# Patient Record
Sex: Female | Born: 1955 | Race: White | Hispanic: No | Marital: Married | Smoking: Former smoker
Health system: Southern US, Community
[De-identification: ages and names within clinical notes are randomized; demographics above are authoritative.]

## PROBLEM LIST (undated history)

## (undated) DIAGNOSIS — F329 Major depressive disorder, single episode, unspecified: Secondary | ICD-10-CM

## (undated) DIAGNOSIS — E079 Disorder of thyroid, unspecified: Secondary | ICD-10-CM

## (undated) DIAGNOSIS — M1990 Unspecified osteoarthritis, unspecified site: Secondary | ICD-10-CM

## (undated) DIAGNOSIS — I639 Cerebral infarction, unspecified: Secondary | ICD-10-CM

## (undated) DIAGNOSIS — K219 Gastro-esophageal reflux disease without esophagitis: Secondary | ICD-10-CM

## (undated) DIAGNOSIS — N159 Renal tubulo-interstitial disease, unspecified: Secondary | ICD-10-CM

## (undated) DIAGNOSIS — G459 Transient cerebral ischemic attack, unspecified: Secondary | ICD-10-CM

## (undated) DIAGNOSIS — F32A Depression, unspecified: Secondary | ICD-10-CM

## (undated) HISTORY — PX: TONSILLECTOMY: SUR1361

## (undated) HISTORY — PX: BACK SURGERY: SHX140

## (undated) HISTORY — PX: SHOULDER SURGERY: SHX246

## (undated) HISTORY — PX: CHOLECYSTECTOMY: SHX55

---

## 2010-09-05 ENCOUNTER — Emergency Department (HOSPITAL_BASED_OUTPATIENT_CLINIC_OR_DEPARTMENT_OTHER)
Admission: EM | Admit: 2010-09-05 | Discharge: 2010-09-05 | Disposition: A | Discharge status: Home / Self Care | Payer: Medicare PPO | Attending: Emergency Medicine | Admitting: Emergency Medicine

## 2010-09-05 ENCOUNTER — Emergency Department (INDEPENDENT_AMBULATORY_CARE_PROVIDER_SITE_OTHER): Payer: Medicare PPO

## 2010-09-05 DIAGNOSIS — E119 Type 2 diabetes mellitus without complications: Secondary | ICD-10-CM | POA: Insufficient documentation

## 2010-09-05 DIAGNOSIS — R1013 Epigastric pain: Secondary | ICD-10-CM | POA: Insufficient documentation

## 2010-09-05 DIAGNOSIS — G8929 Other chronic pain: Secondary | ICD-10-CM | POA: Insufficient documentation

## 2010-09-05 DIAGNOSIS — J45909 Unspecified asthma, uncomplicated: Secondary | ICD-10-CM | POA: Insufficient documentation

## 2010-09-05 DIAGNOSIS — K219 Gastro-esophageal reflux disease without esophagitis: Secondary | ICD-10-CM | POA: Insufficient documentation

## 2010-09-05 DIAGNOSIS — Z79899 Other long term (current) drug therapy: Secondary | ICD-10-CM | POA: Insufficient documentation

## 2010-09-05 DIAGNOSIS — R109 Unspecified abdominal pain: Secondary | ICD-10-CM

## 2010-09-05 DIAGNOSIS — R079 Chest pain, unspecified: Secondary | ICD-10-CM | POA: Insufficient documentation

## 2010-09-05 DIAGNOSIS — I10 Essential (primary) hypertension: Secondary | ICD-10-CM | POA: Insufficient documentation

## 2010-09-05 DIAGNOSIS — E7800 Pure hypercholesterolemia, unspecified: Secondary | ICD-10-CM | POA: Insufficient documentation

## 2010-09-05 LAB — CBC
Hemoglobin: 11.6 g/dL — ABNORMAL LOW (ref 12.0–15.0)
Platelets: 199 10*3/uL (ref 150–400)
RBC: 3.9 MIL/uL (ref 3.87–5.11)
WBC: 6.6 10*3/uL (ref 4.0–10.5)

## 2010-09-05 LAB — COMPREHENSIVE METABOLIC PANEL
ALT: 34 U/L (ref 0–35)
AST: 38 U/L — ABNORMAL HIGH (ref 0–37)
Albumin: 4.1 g/dL (ref 3.5–5.2)
Alkaline Phosphatase: 57 U/L (ref 39–117)
CO2: 26 mEq/L (ref 19–32)
Chloride: 104 mEq/L (ref 96–112)
GFR calc Af Amer: >60 mL/min (ref >60)
Potassium: 4.6 mEq/L (ref 3.5–5.1)
Sodium: 143 mEq/L (ref 135–145)
Total Bilirubin: 0.6 mg/dL (ref 0.3–1.2)

## 2010-09-05 LAB — URINALYSIS, ROUTINE W REFLEX MICROSCOPIC
Glucose, UA: NEGATIVE mg/dL
Protein, ur: NEGATIVE mg/dL
Specific Gravity, Urine: 1.022 (ref 1.005–1.030)
pH: 6 (ref 5.0–8.0)

## 2010-09-05 LAB — DIFFERENTIAL
Basophils Relative: 0 % (ref 0–1)
Eosinophils Absolute: 0.1 10*3/uL (ref 0.0–0.7)
Neutro Abs: 2.8 10*3/uL (ref 1.7–7.7)
Neutrophils Relative %: 42 % — ABNORMAL LOW (ref 43–77)

## 2010-09-05 LAB — URINE MICROSCOPIC-ADD ON

## 2010-09-05 LAB — POCT CARDIAC MARKERS: Myoglobin, poc: 30.8 ng/mL (ref 12–200)

## 2010-09-05 IMAGING — CR DG CHEST 2V
2 series · 2 of 2 positions shown · non-contrast
Comparison: None

CLINICAL DATA: Chest pain

CHEST - 2 VIEW

[w chest pa]
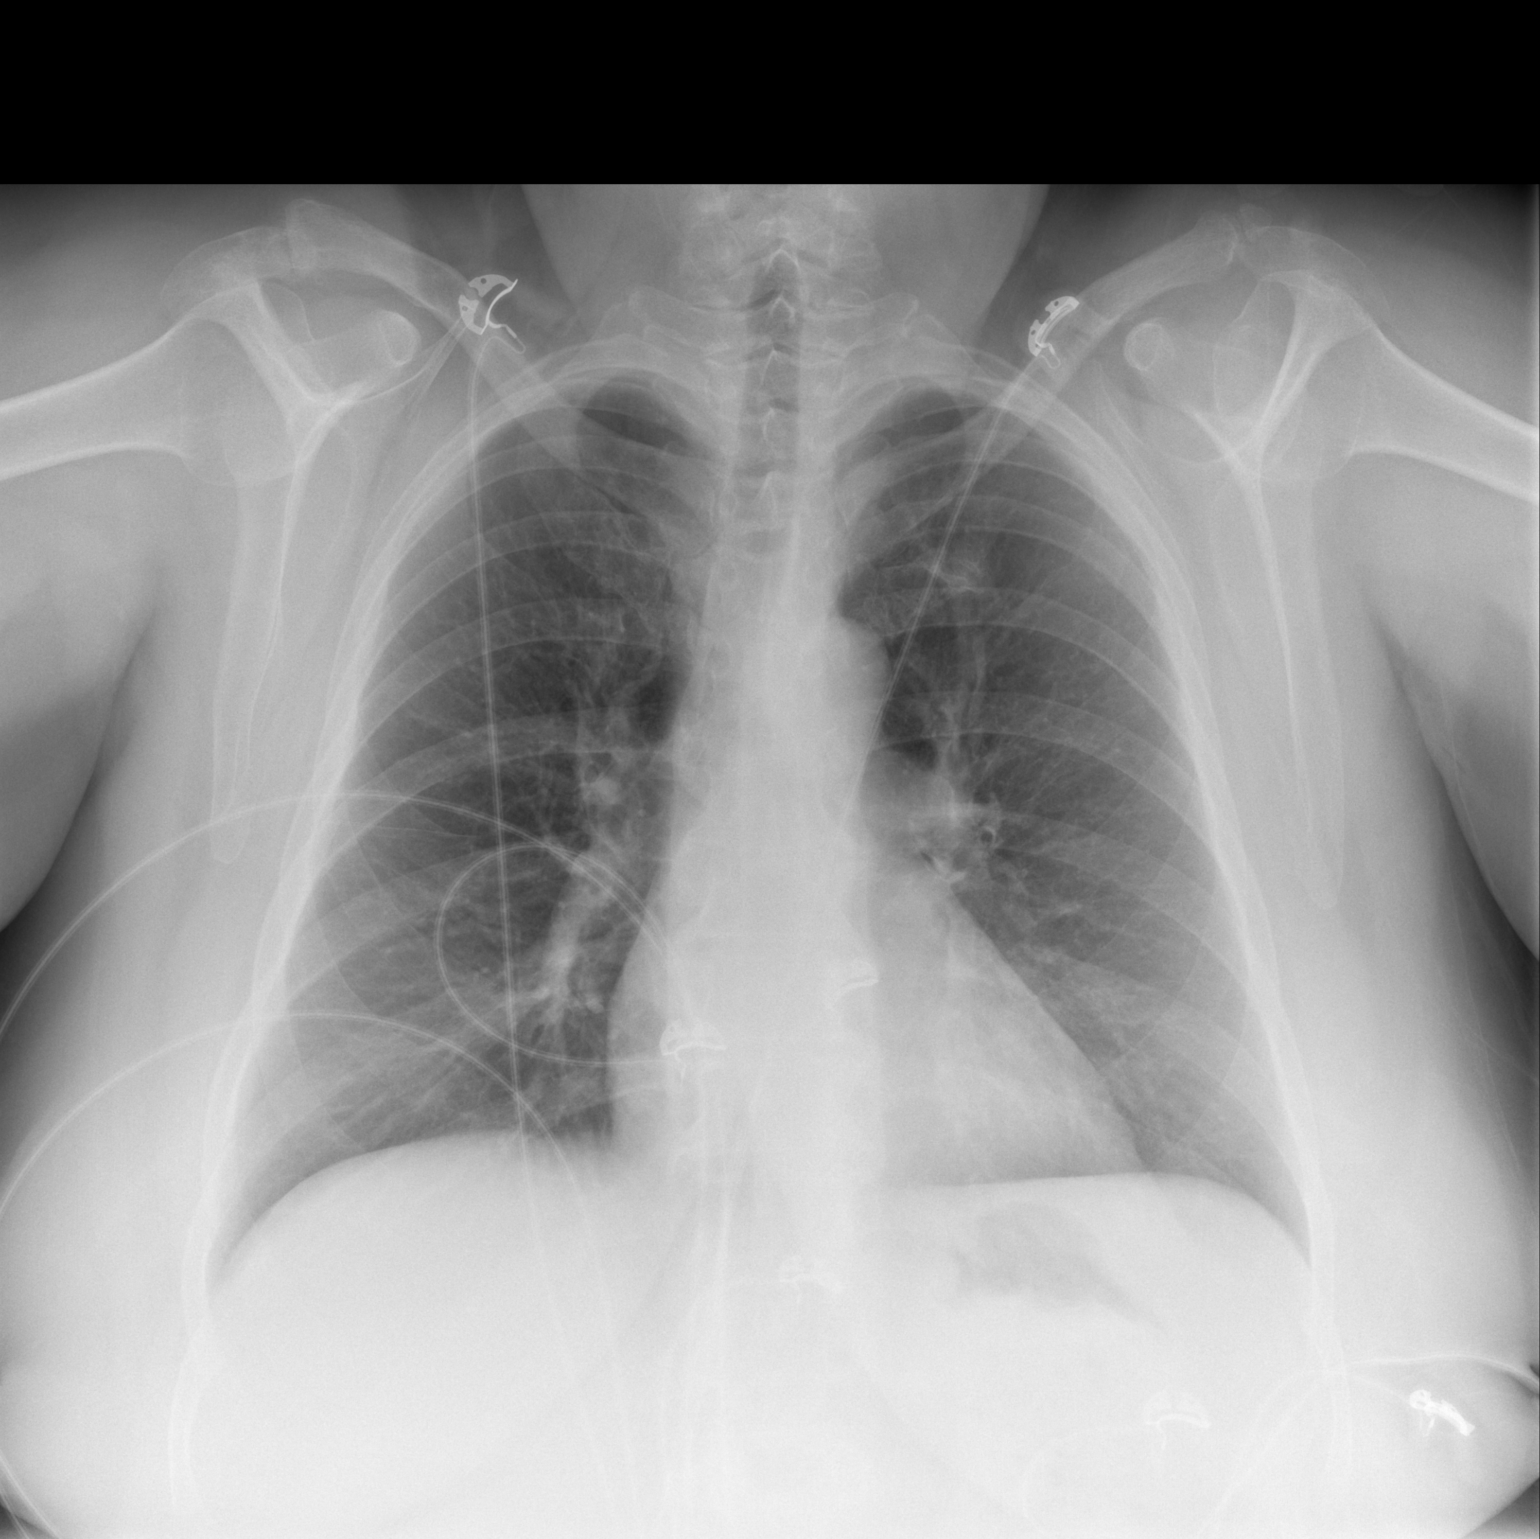

[w chest lat]
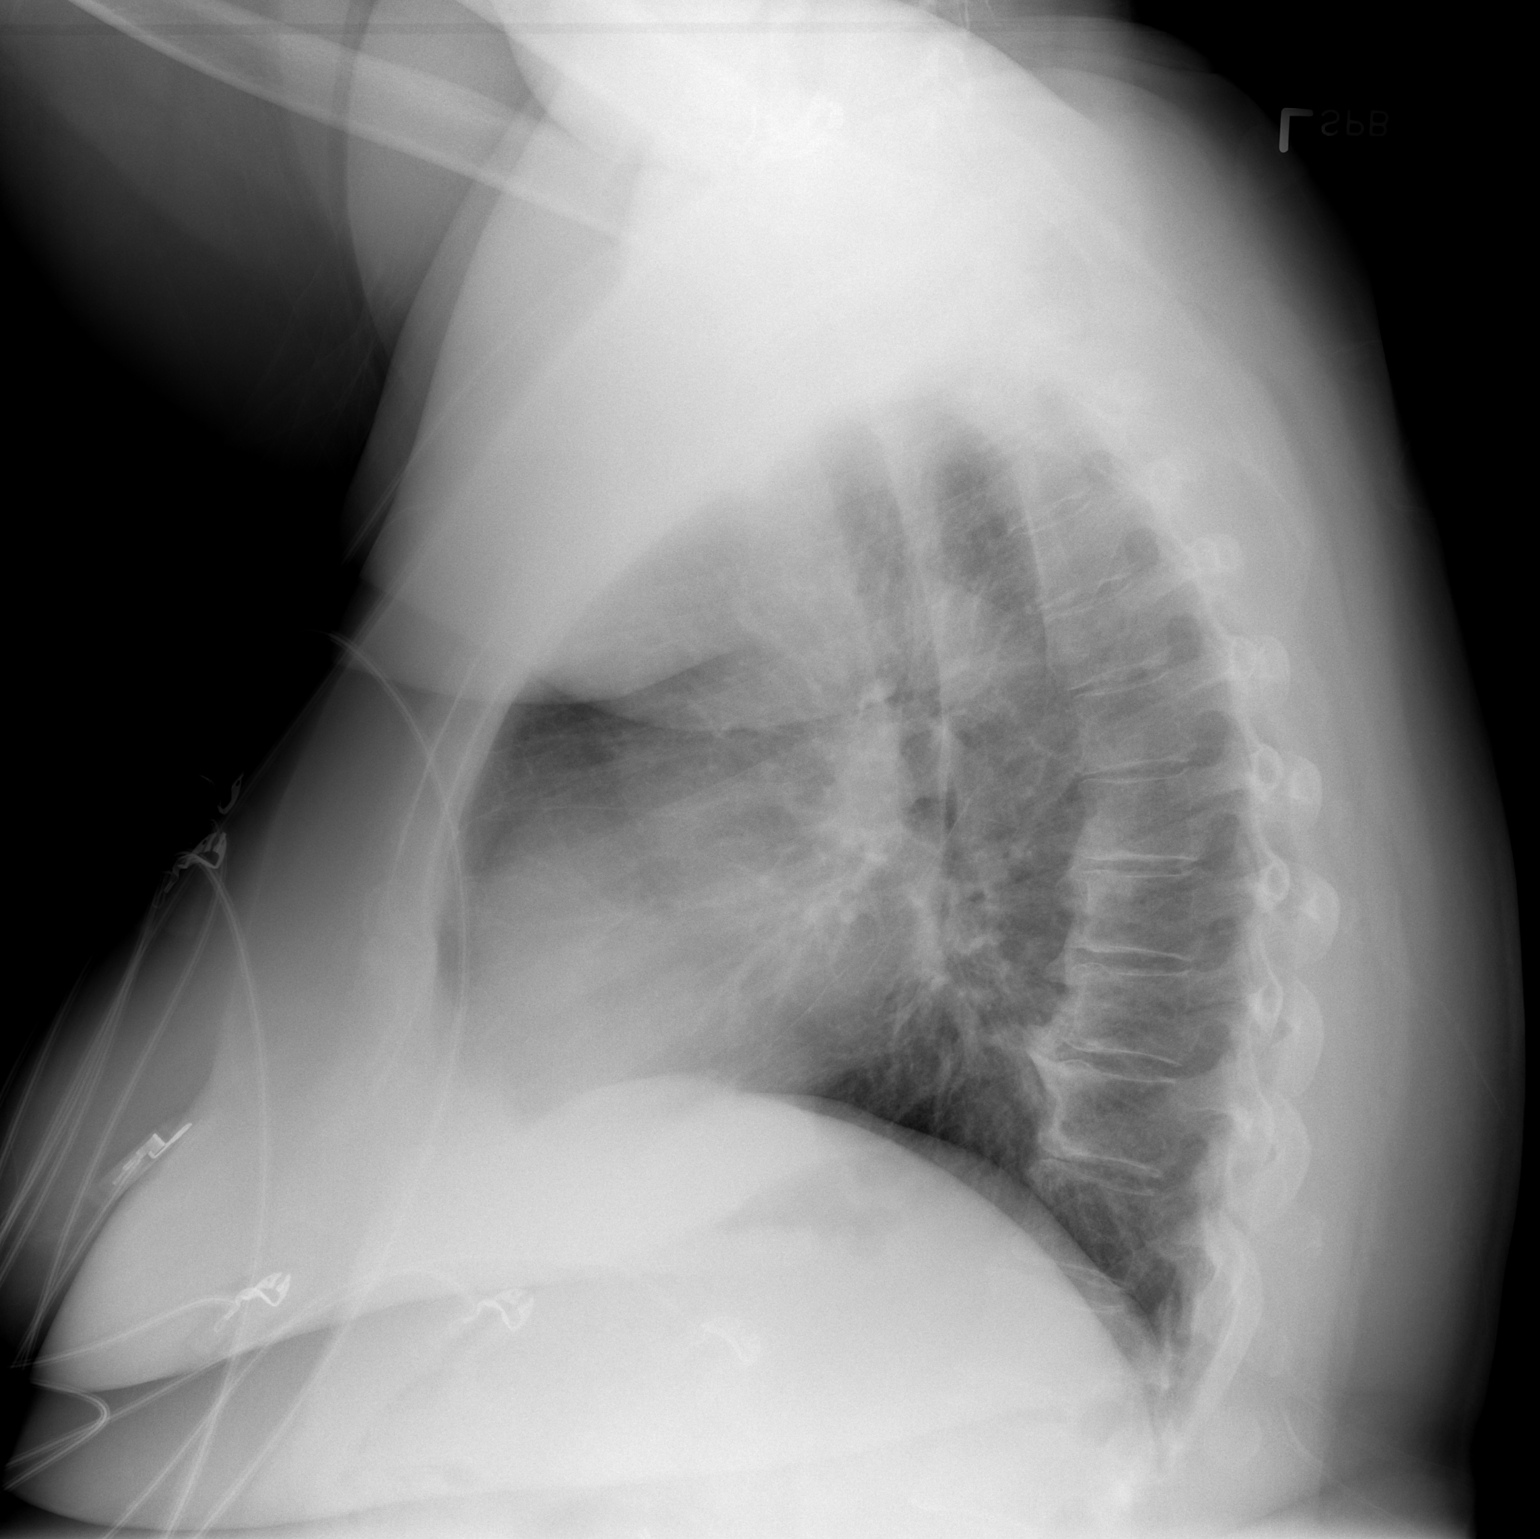

[2 of 2 positions shown; findings below may reference images not displayed]

FINDINGS: The cardiomediastinal silhouette is unremarkable.
There is no evidence of focal airspace disease, pulmonary edema,
pulmonary nodule/mass, pleural effusion, or pneumothorax.
No acute bony abnormalities are identified.
IMPRESSION: No evidence of acute cardiopulmonary disease.

## 2010-09-05 IMAGING — CT CT ABD-PELV W/ CM
2 of 5 series · 17 of 46 positions shown, 19 images · IV contrast (APPLIED)
Comparison: None.

CLINICAL DATA: Abdominal pain.

CT ABDOMEN AND PELVIS WITH CONTRAST
TECHNIQUE: Multidetector CT imaging of the abdomen and pelvis was
performed following the standard protocol during bolus
administration of intravenous contrast.
Contrast: 100 ml of [XP]

[Series 2: abd/pelvis 5.0 b31f · axial · 0.82mm/px · z∈[-464,-39]mm · 14 of 95 slices shown, 16 images]
[im 5/95  soft-tissue]
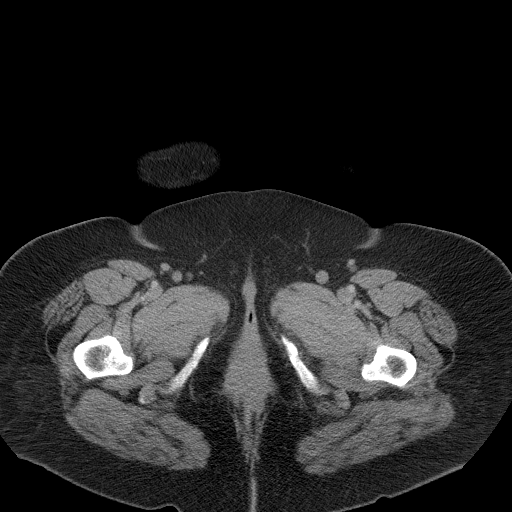
[im 5/95  bone]
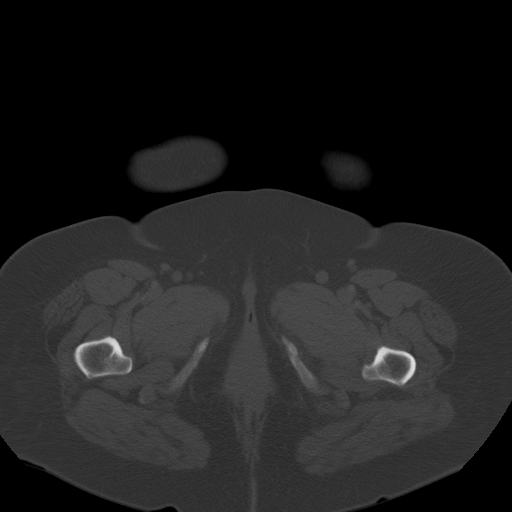
[im 15/95  soft-tissue]
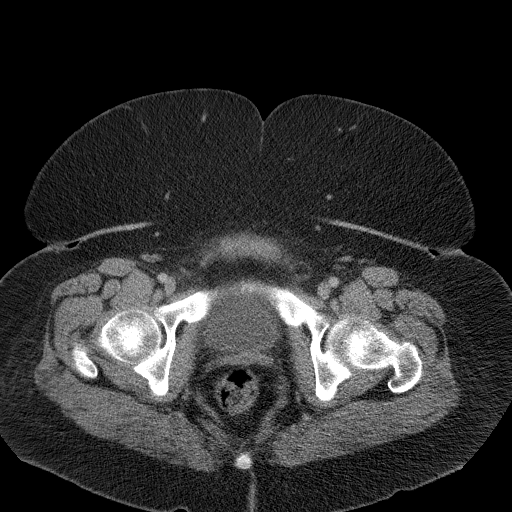
[im 19/95  soft-tissue]
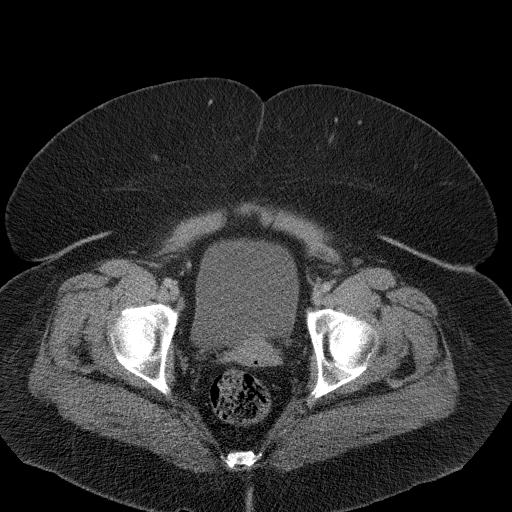
[im 24/95  soft-tissue]
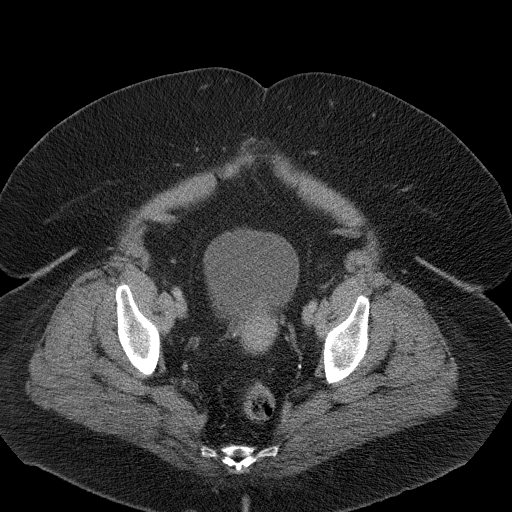
[im 33/95  soft-tissue]
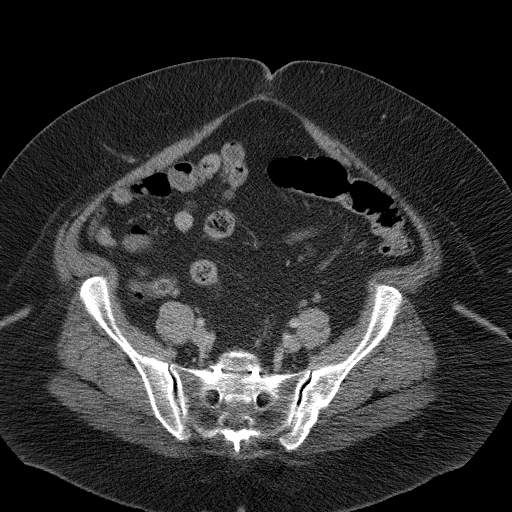
[im 38/95  soft-tissue]
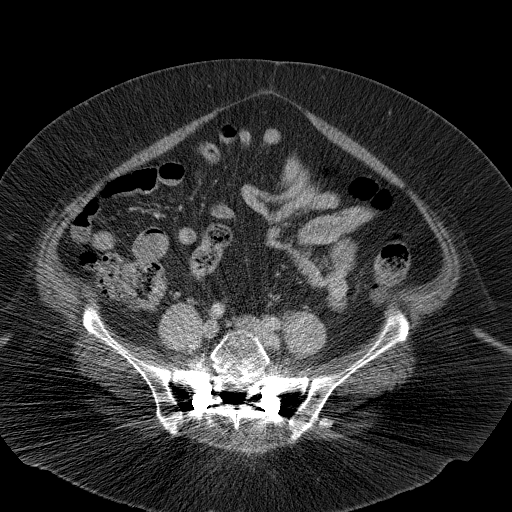
[im 43/95  soft-tissue]
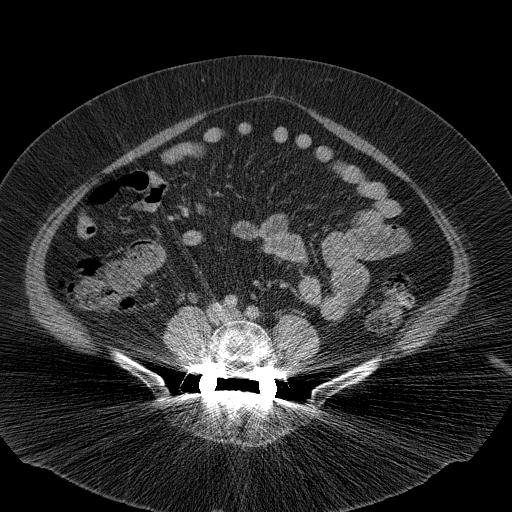
[im 52/95  soft-tissue]
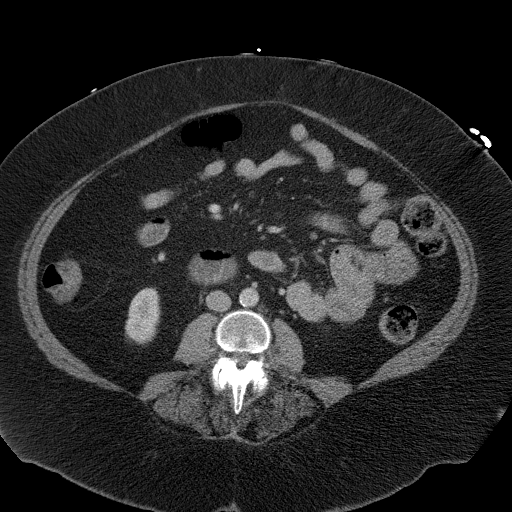
[im 57/95  soft-tissue]
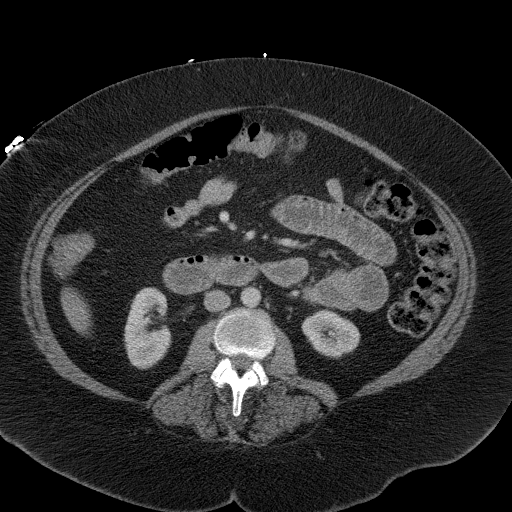
[im 57/95  bone]
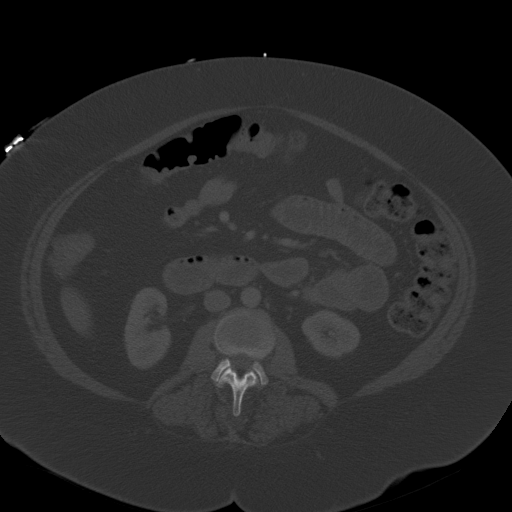
[im 62/95  soft-tissue]
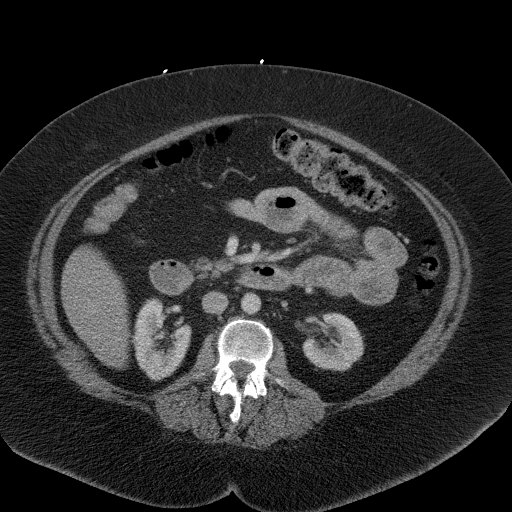
[im 71/95  soft-tissue]
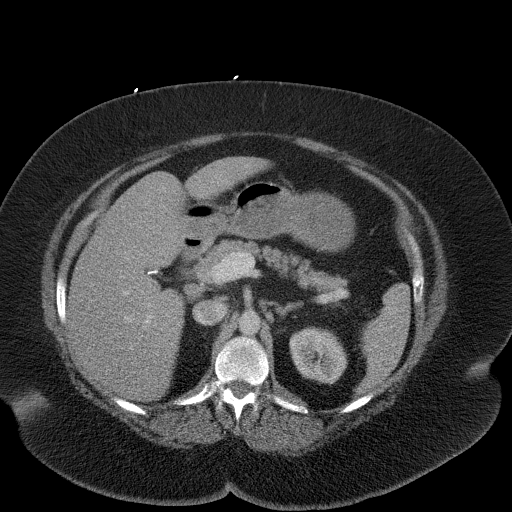
[im 76/95  soft-tissue]
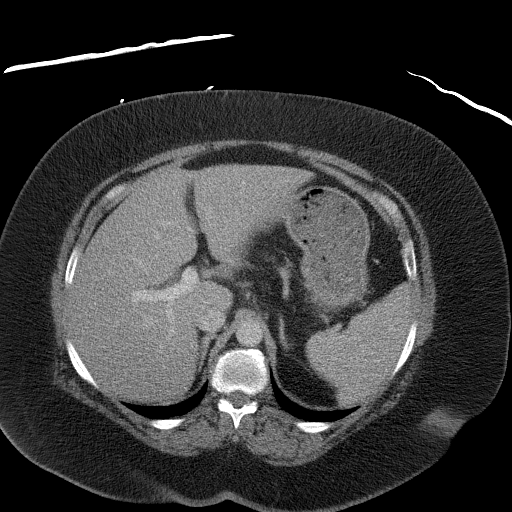
[im 80/95  soft-tissue]
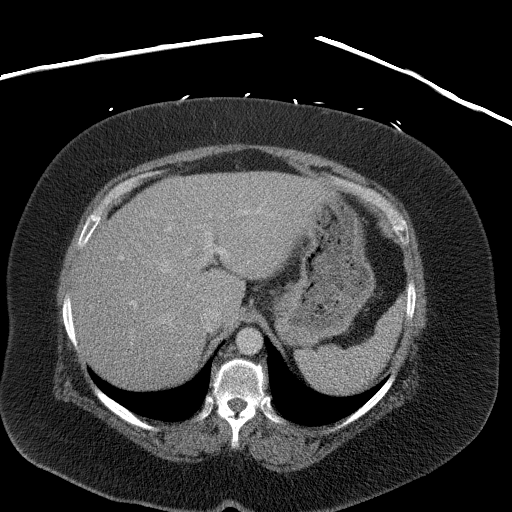
[im 90/95  soft-tissue]
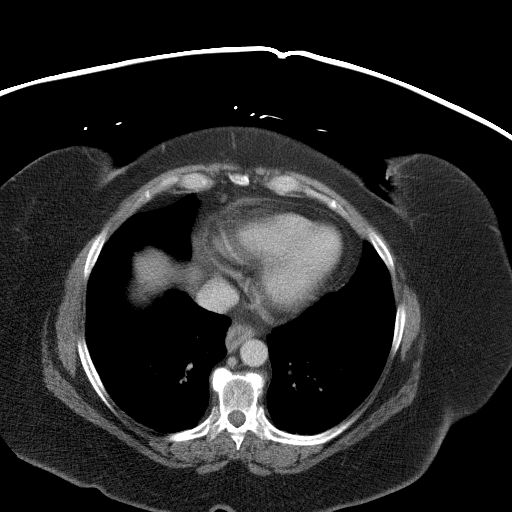

[Series 5: abd/pelvis 3.0 coronal · coronal · 0.96mm/px · 3 of 104 slices shown]
[im 35/104  soft-tissue]
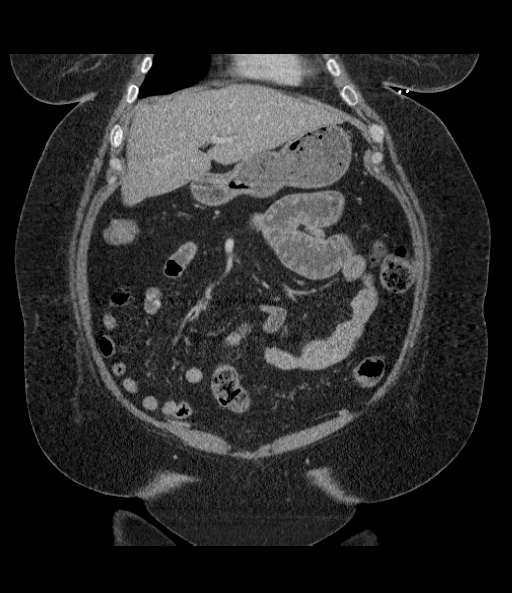
[im 46/104  soft-tissue]
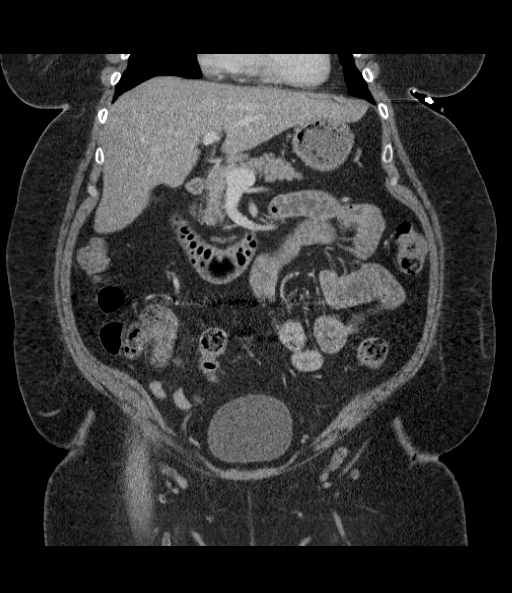
[im 58/104  soft-tissue]
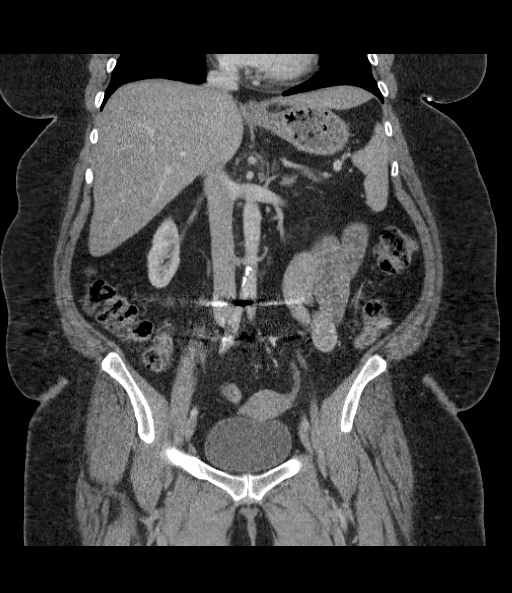

[17 of 46 positions shown; findings below may reference images not displayed]

FINDINGS: The lung bases are clear.  The liver, spleen, adrenal
glands and pancreas are normal.  The kidneys enhance symmetrically.
No hydronephrosis is present.  Some mildly prominent loops of
proximal jejunum are seen in the left upper quadrant without
obstruction. No bowel wall thickening is seen in this area.  The
appendix is absent.  The uterus and ovaries are normal.  Urinary
bladder is unremarkable.  No free fluid or adenopathy is seen in
the abdomen or pelvis.  Posterior lumbar fusion is seen at L4-S1.
There are no aggressive lytic or blastic bone lesions seen.
IMPRESSION: 1.  No acute findings in the abdomen or pelvis.  Minimal prominence
of the proximal jejunum without small bowel obstruction.
2.  Postsurgical changes of appendectomy and cholecystectomy.

## 2010-09-05 MED ORDER — IOHEXOL 300 MG/ML  SOLN
100.0000 mL | Freq: Once | INTRAMUSCULAR | Status: AC | PRN
  Administered 2010-09-05: 100 mL via INTRAVENOUS

## 2010-09-06 LAB — URINE CULTURE: Culture: NO GROWTH

## 2010-09-07 LAB — POCT CARDIAC MARKERS
CKMB, poc: <1 ng/mL — ABNORMAL LOW (ref 1.0–8.0)
Myoglobin, poc: 48.2 ng/mL (ref 12–200)
Troponin i, poc: <0.05 ng/mL (ref 0.00–0.09)

## 2010-11-14 ENCOUNTER — Emergency Department (INDEPENDENT_AMBULATORY_CARE_PROVIDER_SITE_OTHER): Payer: Medicare PPO

## 2010-11-14 ENCOUNTER — Emergency Department (HOSPITAL_BASED_OUTPATIENT_CLINIC_OR_DEPARTMENT_OTHER)
Admission: EM | Admit: 2010-11-14 | Discharge: 2010-11-15 | Disposition: A | Discharge status: Other Acute Inpatient Hospital | Payer: Medicare PPO | Attending: Emergency Medicine | Admitting: Emergency Medicine

## 2010-11-14 DIAGNOSIS — E119 Type 2 diabetes mellitus without complications: Secondary | ICD-10-CM | POA: Insufficient documentation

## 2010-11-14 DIAGNOSIS — Z79899 Other long term (current) drug therapy: Secondary | ICD-10-CM | POA: Insufficient documentation

## 2010-11-14 DIAGNOSIS — J45909 Unspecified asthma, uncomplicated: Secondary | ICD-10-CM | POA: Insufficient documentation

## 2010-11-14 DIAGNOSIS — M25519 Pain in unspecified shoulder: Secondary | ICD-10-CM | POA: Insufficient documentation

## 2010-11-14 DIAGNOSIS — R1013 Epigastric pain: Secondary | ICD-10-CM

## 2010-11-14 DIAGNOSIS — I10 Essential (primary) hypertension: Secondary | ICD-10-CM | POA: Insufficient documentation

## 2010-11-14 DIAGNOSIS — G8929 Other chronic pain: Secondary | ICD-10-CM | POA: Insufficient documentation

## 2010-11-14 DIAGNOSIS — R110 Nausea: Secondary | ICD-10-CM

## 2010-11-14 DIAGNOSIS — E7800 Pure hypercholesterolemia, unspecified: Secondary | ICD-10-CM | POA: Insufficient documentation

## 2010-11-14 DIAGNOSIS — R079 Chest pain, unspecified: Secondary | ICD-10-CM | POA: Insufficient documentation

## 2010-11-14 DIAGNOSIS — R0602 Shortness of breath: Secondary | ICD-10-CM | POA: Insufficient documentation

## 2010-11-14 LAB — CBC
MCH: 29 pg (ref 26.0–34.0)
MCHC: 32.6 g/dL (ref 30.0–36.0)
Platelets: 45 10*3/uL — ABNORMAL LOW (ref 150–400)
RBC: 3.93 MIL/uL (ref 3.87–5.11)

## 2010-11-14 LAB — BASIC METABOLIC PANEL
Calcium: 9.5 mg/dL (ref 8.4–10.5)
Creatinine, Ser: 0.9 mg/dL (ref 0.4–1.2)
GFR calc Af Amer: >60 mL/min (ref >60)

## 2010-11-14 IMAGING — CR DG CHEST 1V PORT
1 series · 1 of 1 positions shown · non-contrast
Comparison: PA and lateral chest [DATE].

CLINICAL DATA: Chest pain.  Nausea.

PORTABLE CHEST - 1 VIEW

[view not recorded]
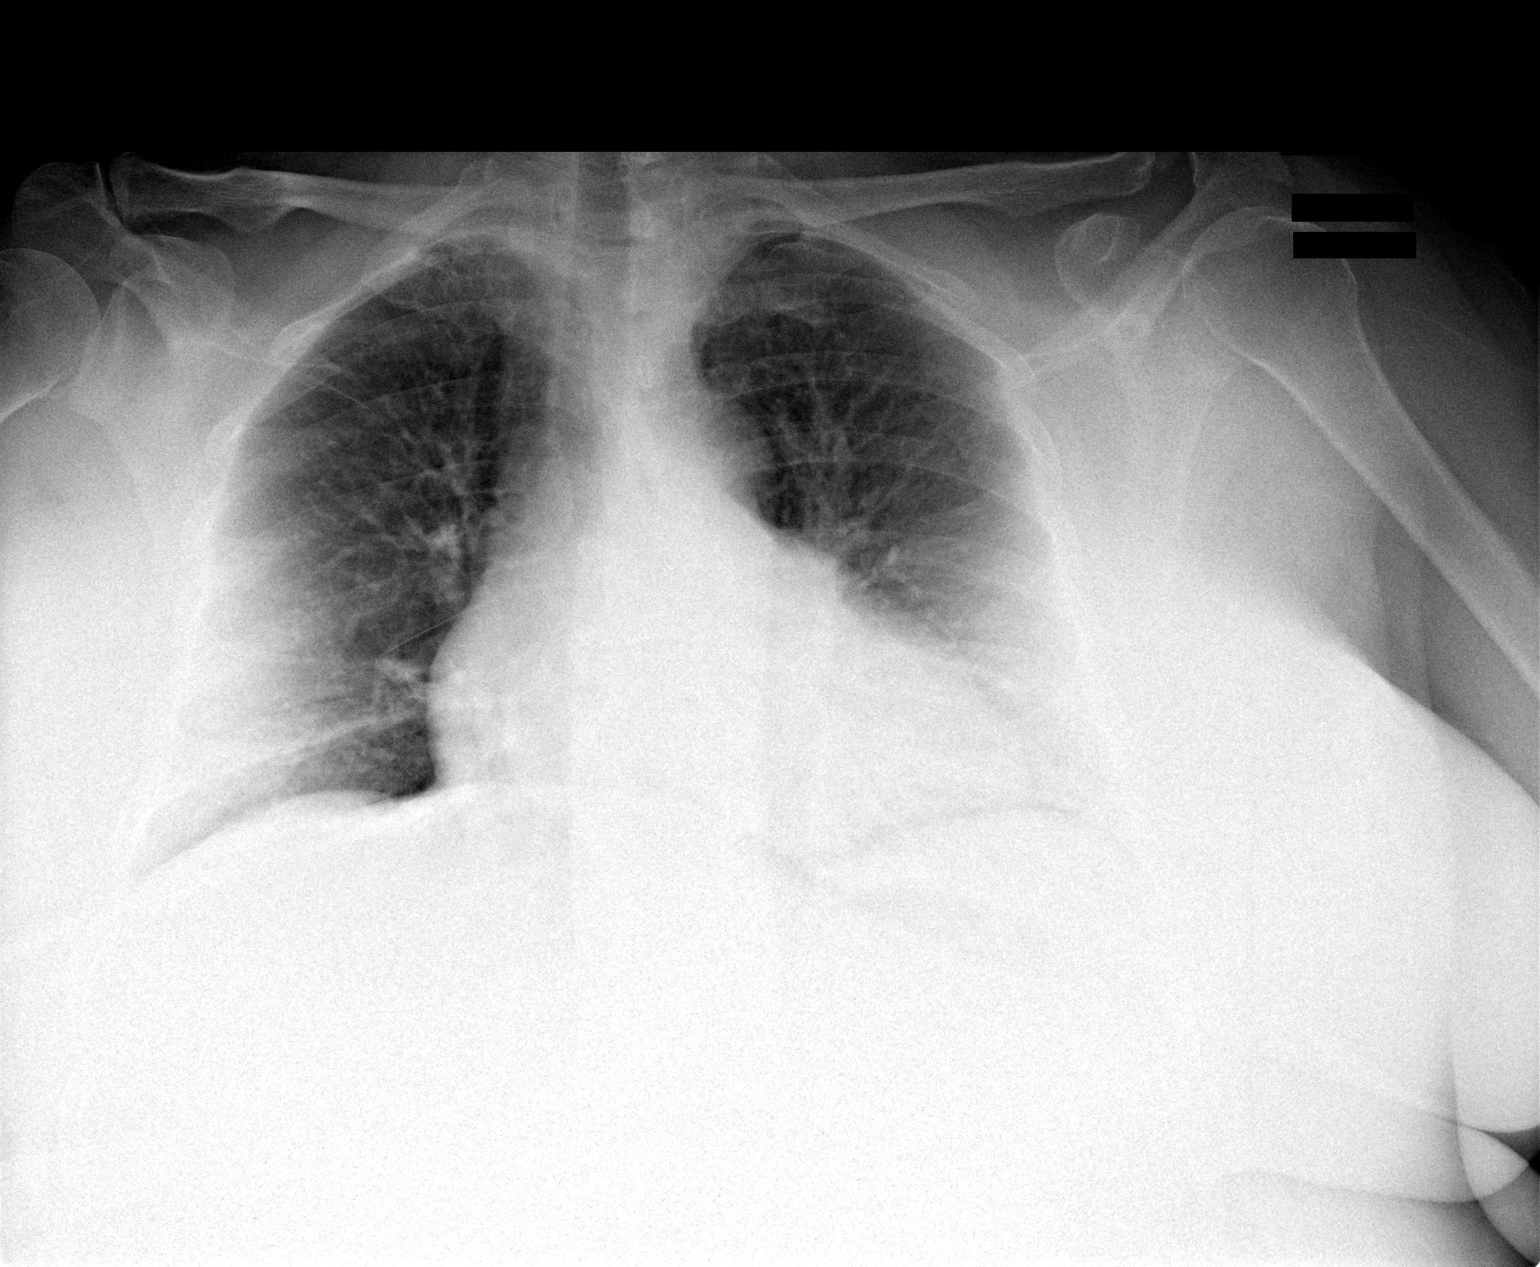

[1 of 1 positions shown; findings below may reference images not displayed]

FINDINGS: Heart size is mildly enlarged with pulmonary vascular
congestion.  No focal airspace disease or effusion.  No
pneumothorax.
IMPRESSION: Pulmonary vascular congestion without focal process.

## 2010-11-14 IMAGING — CR DG CHEST 2V
2 series · 2 of 2 positions shown · non-contrast
Comparison: [DATE]

CLINICAL DATA: Epigastric pain radiating to left shoulder

CHEST - 2 VIEW

[w chest pa]
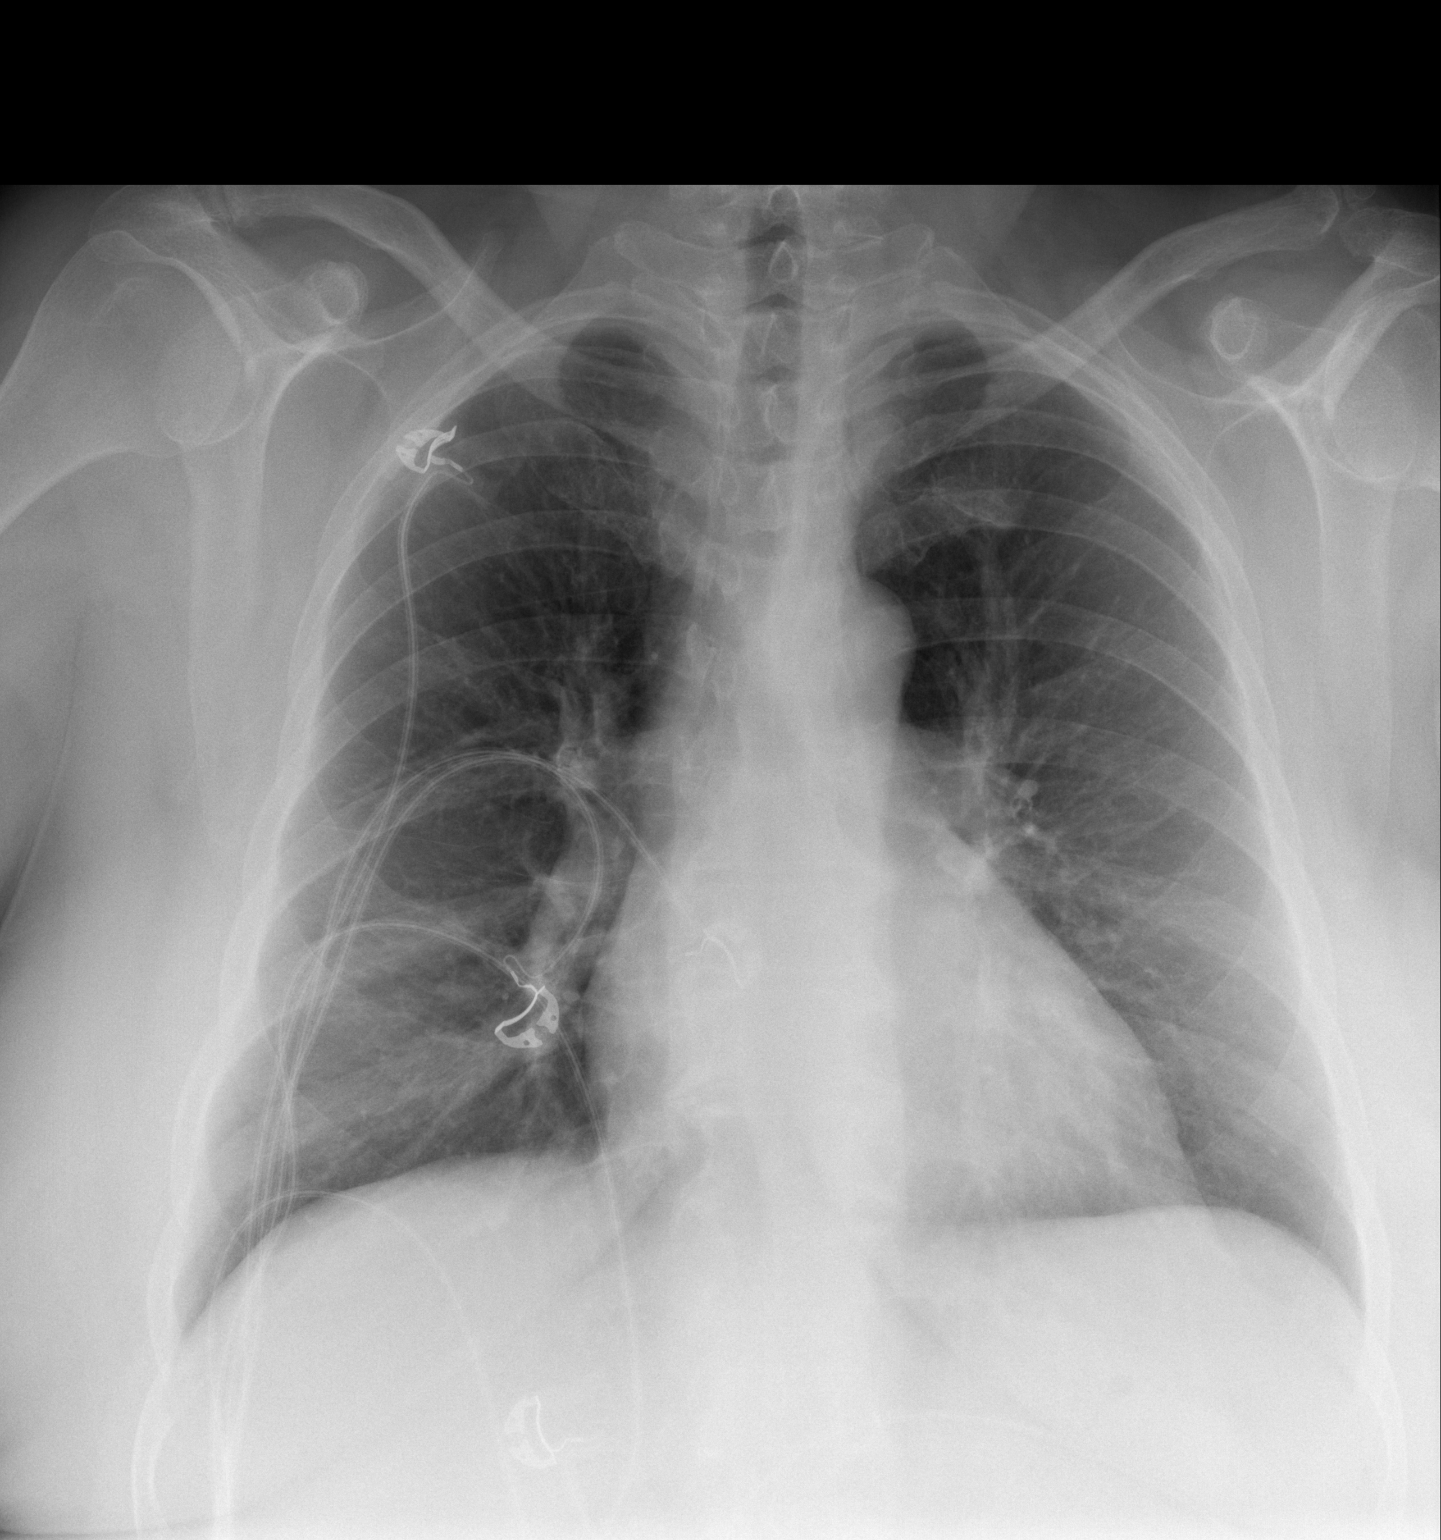

[w chest lat]
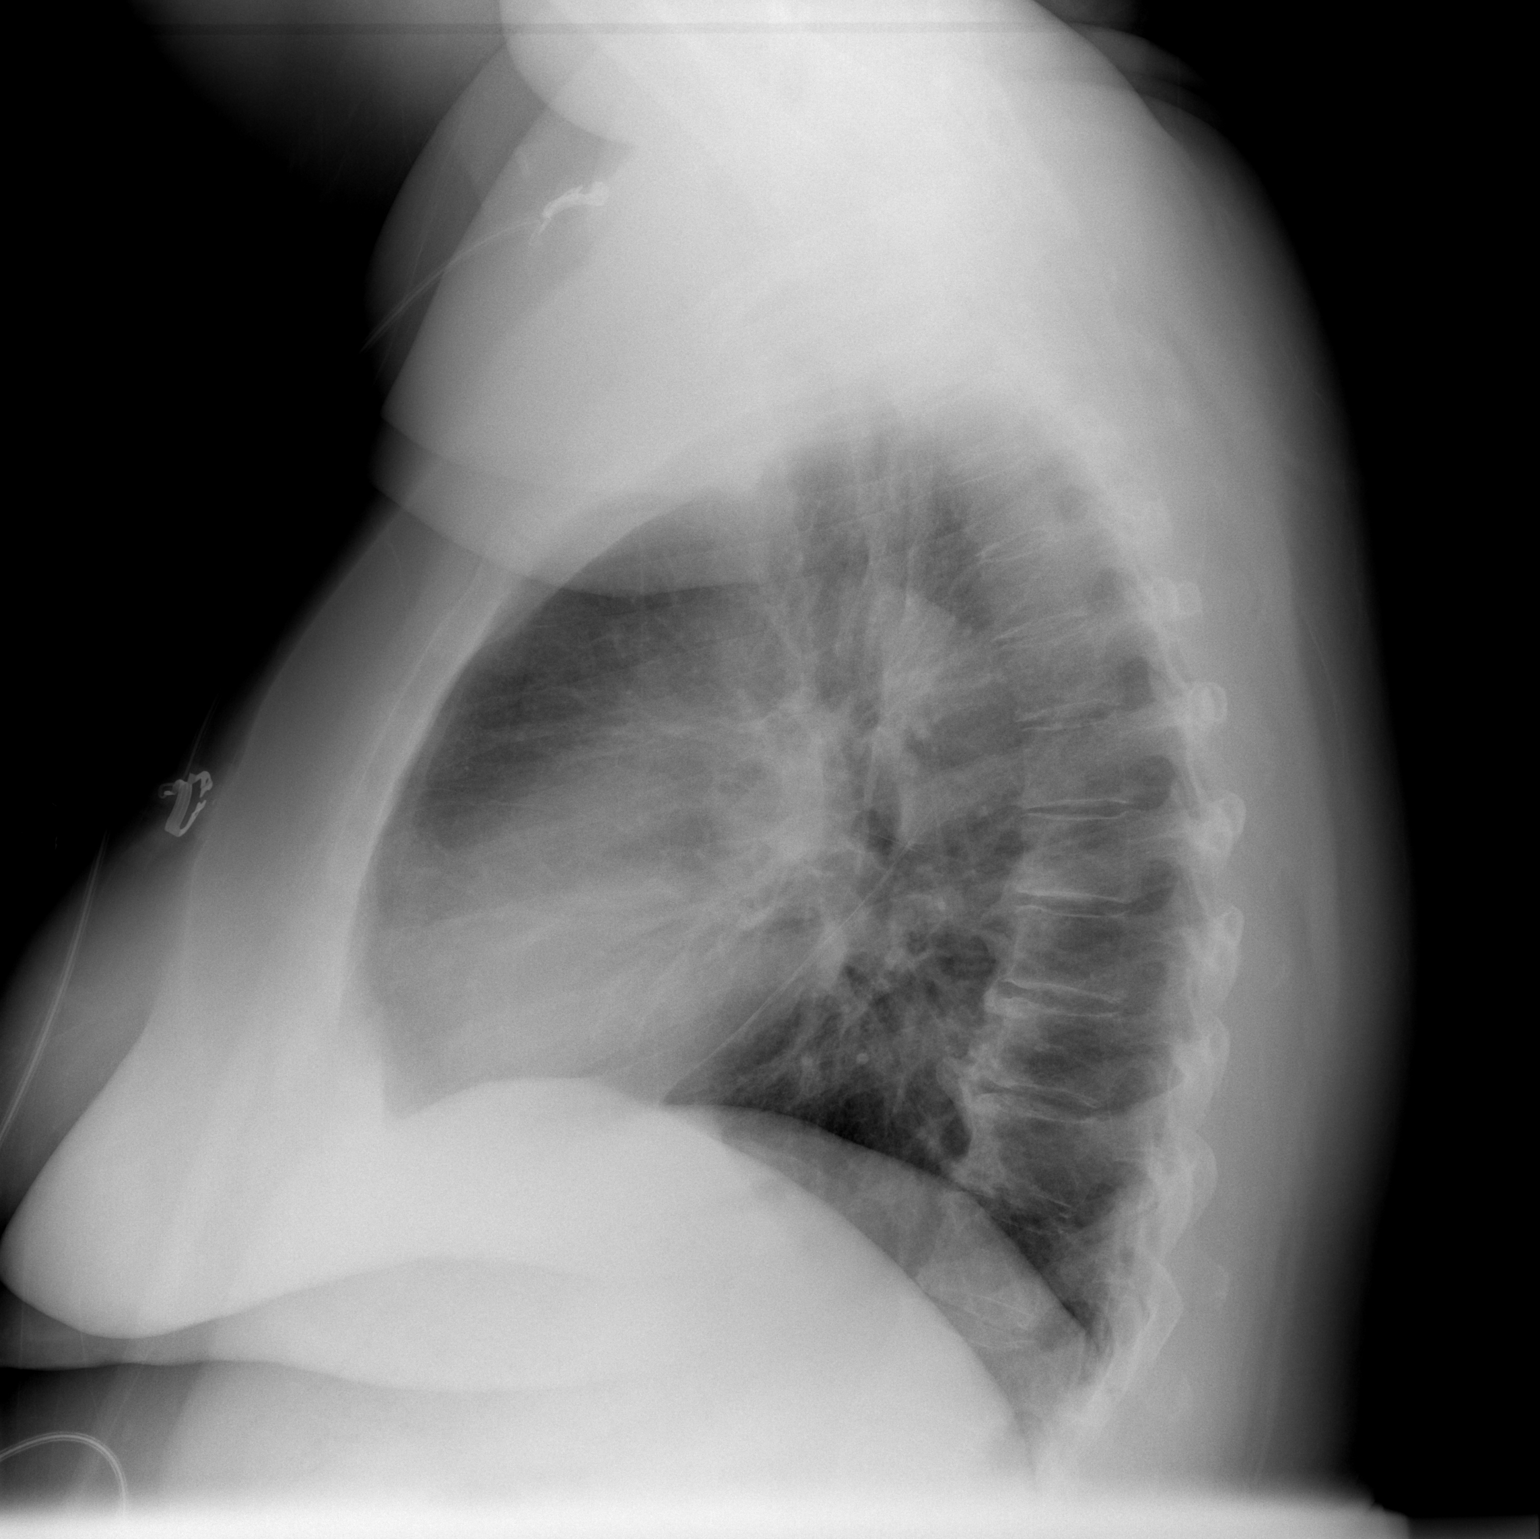

[2 of 2 positions shown; findings below may reference images not displayed]

FINDINGS: Lungs are clear. No pleural effusion or pneumothorax.

Cardiomediastinal silhouette is within normal limits.

Degenerative changes of the visualized thoracolumbar spine.
IMPRESSION: No evidence of acute cardiopulmonary disease.

## 2010-11-15 LAB — CK TOTAL AND CKMB (NOT AT ARMC)
CK, MB: 1.3 ng/mL (ref 0.3–4.0)
Relative Index: INVALID (ref 0.0–2.5)
Total CK: 49 U/L (ref 7–177)

## 2010-11-15 LAB — TROPONIN I

## 2010-11-15 LAB — PRO B NATRIURETIC PEPTIDE: Pro B Natriuretic peptide (BNP): 246.3 pg/mL — ABNORMAL HIGH (ref 0–125)

## 2011-02-13 ENCOUNTER — Emergency Department (HOSPITAL_BASED_OUTPATIENT_CLINIC_OR_DEPARTMENT_OTHER)
Admission: EM | Admit: 2011-02-13 | Discharge: 2011-02-13 | Disposition: A | Discharge status: Home / Self Care | Payer: Medicare PPO | Attending: Emergency Medicine | Admitting: Emergency Medicine

## 2011-02-13 ENCOUNTER — Encounter: Payer: Self-pay | Admitting: *Deleted

## 2011-02-13 ENCOUNTER — Emergency Department (INDEPENDENT_AMBULATORY_CARE_PROVIDER_SITE_OTHER): Payer: Medicare PPO

## 2011-02-13 DIAGNOSIS — E079 Disorder of thyroid, unspecified: Secondary | ICD-10-CM | POA: Insufficient documentation

## 2011-02-13 DIAGNOSIS — M25569 Pain in unspecified knee: Secondary | ICD-10-CM | POA: Insufficient documentation

## 2011-02-13 DIAGNOSIS — J45909 Unspecified asthma, uncomplicated: Secondary | ICD-10-CM | POA: Insufficient documentation

## 2011-02-13 DIAGNOSIS — E119 Type 2 diabetes mellitus without complications: Secondary | ICD-10-CM | POA: Insufficient documentation

## 2011-02-13 DIAGNOSIS — Z8679 Personal history of other diseases of the circulatory system: Secondary | ICD-10-CM | POA: Insufficient documentation

## 2011-02-13 DIAGNOSIS — K219 Gastro-esophageal reflux disease without esophagitis: Secondary | ICD-10-CM | POA: Insufficient documentation

## 2011-02-13 DIAGNOSIS — M25561 Pain in right knee: Secondary | ICD-10-CM

## 2011-02-13 DIAGNOSIS — R209 Unspecified disturbances of skin sensation: Secondary | ICD-10-CM | POA: Insufficient documentation

## 2011-02-13 DIAGNOSIS — Z8739 Personal history of other diseases of the musculoskeletal system and connective tissue: Secondary | ICD-10-CM | POA: Insufficient documentation

## 2011-02-13 HISTORY — DX: Disorder of thyroid, unspecified: E07.9

## 2011-02-13 HISTORY — DX: Cerebral infarction, unspecified: I63.9

## 2011-02-13 HISTORY — DX: Depression, unspecified: F32.A

## 2011-02-13 HISTORY — DX: Major depressive disorder, single episode, unspecified: F32.9

## 2011-02-13 HISTORY — DX: Transient cerebral ischemic attack, unspecified: G45.9

## 2011-02-13 HISTORY — DX: Renal tubulo-interstitial disease, unspecified: N15.9

## 2011-02-13 HISTORY — DX: Gastro-esophageal reflux disease without esophagitis: K21.9

## 2011-02-13 HISTORY — DX: Unspecified osteoarthritis, unspecified site: M19.90

## 2011-02-13 IMAGING — CR DG KNEE 1-2V*R*
2 series · 2 of 2 positions shown · non-contrast
Comparison: None.

CLINICAL DATA: Pain for 1 week

RIGHT KNEE - 1-2 VIEW

[t knee ap right]
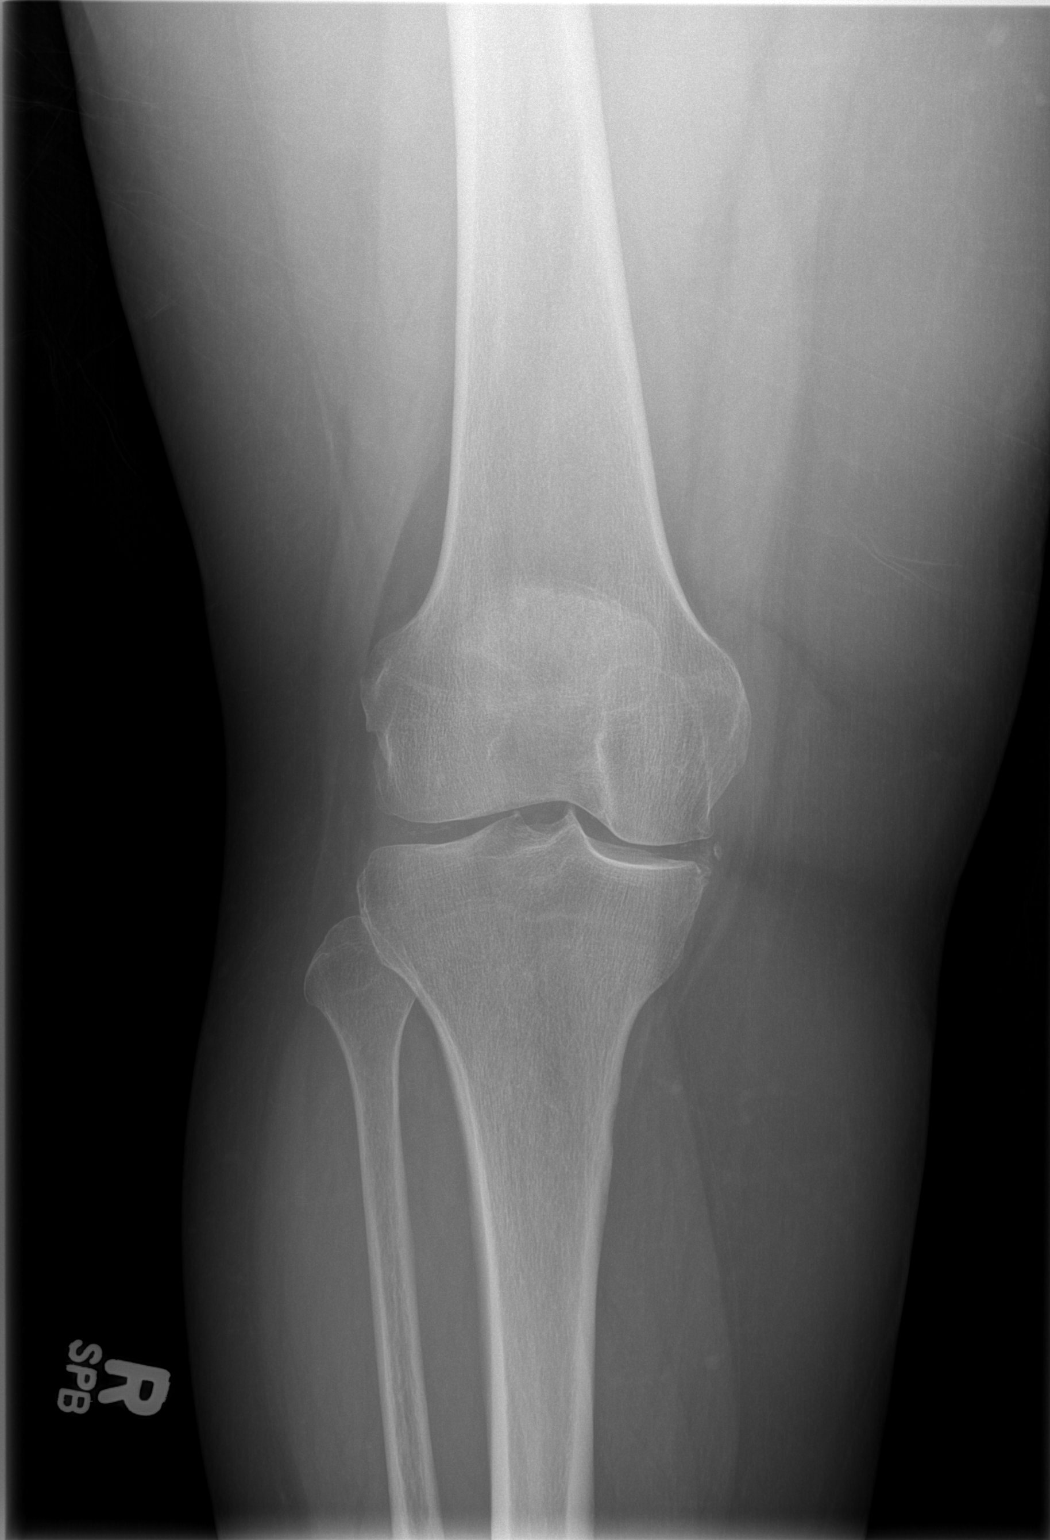

[t knee lat right]
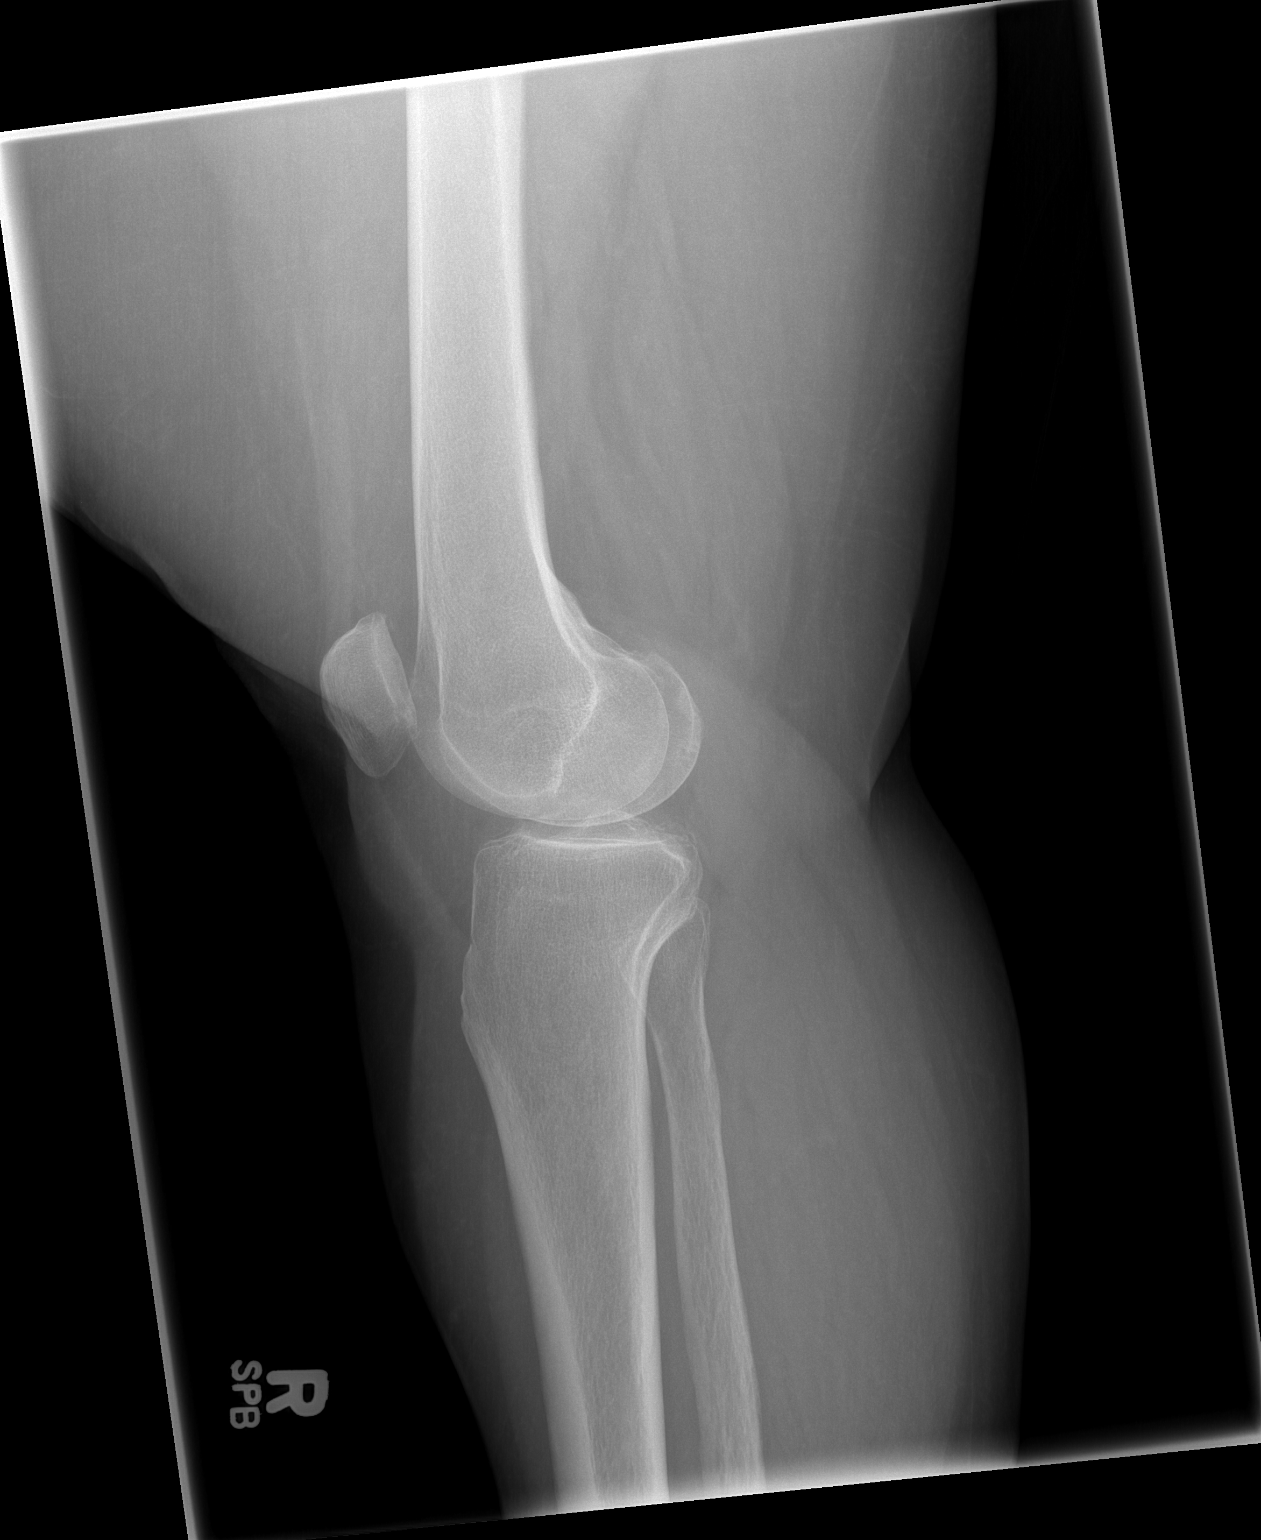

[2 of 2 positions shown; findings below may reference images not displayed]

FINDINGS: There is no evidence of fracture, dislocation, or joint
effusion.  There is no evidence of significant joint space
narrowing or other focal bone abnormality.  Soft tissues are
unremarkable.Slight cartilage calcification with possible small
loose body medially.
IMPRESSION: No acute findings.  No fracture, significant joint
space narrowing, or effusion.

## 2011-02-13 MED ORDER — HYDROCODONE-ACETAMINOPHEN 5-325 MG PO TABS
2.0000 | ORAL_TABLET | Freq: Once | ORAL | Status: AC
  Administered 2011-02-13: 2 via ORAL
  Filled 2011-02-13: qty 2

## 2011-02-13 MED ORDER — MORPHINE SULFATE 2 MG/ML IJ SOLN
2.0000 mg | Freq: Once | INTRAMUSCULAR | Status: AC
  Administered 2011-02-13: 2 mg via INTRAMUSCULAR
  Filled 2011-02-13: qty 1

## 2011-02-13 MED ORDER — OXYCODONE-ACETAMINOPHEN 5-325 MG PO TABS: 1.0000 | ORAL_TABLET | ORAL | Status: AC | PRN

## 2011-02-13 NOTE — ED Notes (Signed)
Patient states she has chronic knee pain for many years.  States she has had a flare up of this current knee pain for one week.  Taking OTC tylenol with no relief.

## 2011-02-13 NOTE — ED Provider Notes (Addendum)
History    this is a 55 year old female comes in complaining today of right knee pain. She states that she has had chronic knee pain for many years. She is having worsening pain in the right knee for the past week. She is taking over the counter Tylenol without relief. She has not noted any redness or swelling or fever. She has had not had any new injury. She is using a cane to walk.      CSN: [id number] Arrival date & time: 02/13/2011 11:00 AM  Chief Complaint  Patient presents with  . Knee Pain   HPI     Past Medical History  Diagnosis Date  . Angina pectoris     negative stress test May 2012 at [facility]  . Asthma   . Acid reflux   . Infections of kidney     As child, no problems since  . Diabetes mellitus   . Stroke   . TIA (transient ischemic attack)     2010  . Depressed   . Thyroid disease   . Arthritis     Past Surgical History  Procedure Date  . Back surgery   . Cesarean section     x 2  . Shoulder surgery   . Cholecystectomy   . Tonsillectomy     Family History  Problem Relation Age of Onset  . Diabetes Mother   . Heart failure Mother   . Cancer Father     History  Substance Use Topics  . Smoking status: Former Games developer  . Smokeless tobacco: Not on file  . Alcohol Use: No    OB History    Grav Para Term Preterm Abortions TAB SAB Ect Mult Living   7 2              Review of Systems  All other systems reviewed and are negative.    Physical Exam  BP 115/69  Pulse 63  Temp(Src) 98.6 F (37 C) (Oral)  Resp 18  Ht 5\' 1"  (1.549 m)  Wt 267 lb (121.11 kg)  BMI 50.45 kg/m2  SpO2 96%  Physical Exam  Nursing note reviewed. Constitutional: She is oriented to person, place, and time. She appears well-developed.  HENT:  Head: Normocephalic.  Eyes: Pupils are equal, round, and reactive to light.  Neck: Normal range of motion.  Abdominal: Soft.  Musculoskeletal: Normal range of motion. She exhibits tenderness. She exhibits no edema.         Tenderness is noted diffusely of the right knee. There is no effusion noted. There is no crepitus noted. She has full active range of motion of the joint. There is no ligamentous laxity noted. Pulses are intact and normal distal to the knee.  Neurological: She is alert and oriented to person, place, and time. She has normal reflexes.  Skin: Skin is warm.  Psychiatric: She has a normal mood and affect.    ED Course  Procedures  MDM       Hilario Quarry, MD 02/14/11 [id number]  Hilario Quarry, MD 02/17/11 [id number]

## 2011-03-11 ENCOUNTER — Emergency Department (HOSPITAL_BASED_OUTPATIENT_CLINIC_OR_DEPARTMENT_OTHER)
Admission: EM | Admit: 2011-03-11 | Discharge: 2011-03-11 | Disposition: A | Discharge status: Home / Self Care | Payer: Medicare PPO | Attending: Emergency Medicine | Admitting: Emergency Medicine

## 2011-03-11 ENCOUNTER — Encounter (HOSPITAL_BASED_OUTPATIENT_CLINIC_OR_DEPARTMENT_OTHER): Payer: Self-pay | Admitting: Emergency Medicine

## 2011-03-11 DIAGNOSIS — Z8679 Personal history of other diseases of the circulatory system: Secondary | ICD-10-CM | POA: Insufficient documentation

## 2011-03-11 DIAGNOSIS — Z79899 Other long term (current) drug therapy: Secondary | ICD-10-CM | POA: Insufficient documentation

## 2011-03-11 DIAGNOSIS — J45909 Unspecified asthma, uncomplicated: Secondary | ICD-10-CM | POA: Insufficient documentation

## 2011-03-11 DIAGNOSIS — E1169 Type 2 diabetes mellitus with other specified complication: Secondary | ICD-10-CM | POA: Insufficient documentation

## 2011-03-11 DIAGNOSIS — E079 Disorder of thyroid, unspecified: Secondary | ICD-10-CM | POA: Insufficient documentation

## 2011-03-11 DIAGNOSIS — E162 Hypoglycemia, unspecified: Secondary | ICD-10-CM

## 2011-03-11 DIAGNOSIS — K219 Gastro-esophageal reflux disease without esophagitis: Secondary | ICD-10-CM | POA: Insufficient documentation

## 2011-03-11 DIAGNOSIS — Z8739 Personal history of other diseases of the musculoskeletal system and connective tissue: Secondary | ICD-10-CM | POA: Insufficient documentation

## 2011-03-11 LAB — URINALYSIS, ROUTINE W REFLEX MICROSCOPIC
Leukocytes, UA: NEGATIVE
Nitrite: NEGATIVE
Specific Gravity, Urine: 1.006 (ref 1.005–1.030)
Urobilinogen, UA: 0.2 mg/dL (ref 0.0–1.0)
pH: 6 (ref 5.0–8.0)

## 2011-03-11 LAB — BASIC METABOLIC PANEL
Chloride: 102 mEq/L (ref 96–112)
GFR calc Af Amer: >60 mL/min (ref >60)
Potassium: 4.4 mEq/L (ref 3.5–5.1)
Sodium: 140 mEq/L (ref 135–145)

## 2011-03-11 LAB — CBC
Platelets: 198 10*3/uL (ref 150–400)
RDW: 15.2 % (ref 11.5–15.5)
WBC: 8 10*3/uL (ref 4.0–10.5)

## 2011-03-11 MED ORDER — ACETAMINOPHEN 325 MG PO TABS
650.0000 mg | ORAL_TABLET | Freq: Once | ORAL | Status: AC
  Administered 2011-03-11: 650 mg via ORAL
  Filled 2011-03-11: qty 2

## 2011-03-11 NOTE — ED Notes (Signed)
Pt. Had 12lead on arrival with EMS at Pt. Home due to some minor discomfort.

## 2011-03-11 NOTE — ED Notes (Signed)
Pt. Son and daughter in law with her

## 2011-03-11 NOTE — ED Provider Notes (Signed)
History     CSN: [id number] Arrival date & time: 03/11/2011  4:23 PM   Chief Complaint  Patient presents with  . Weakness     (Include location/radiation/quality/duration/timing/severity/associated sxs/prior treatment) HPI Comments: Patient presents today after an episode of hypoglycemia. Apparently she went to the [employer] with her husband in started to not feel well and told him that she needed to eat something. She went and had a chicken dinner. And she then got in her car and drove home. She does not recall the entire drive home but was able to call her son who came out to check on her since she lives with her. He noted that she was not acting like herself and they checked her sugar and it was 53. They gave her some chocolate milk and some cookies and by the time EMS arrived her sugar was coming up to the 150s. Son notes that she was initially confused and didn't know the appropriate day the week or year but that now she appears to be at her baseline. She notes that she has not any recent diabetic medication changes. She did have  breakfast as well as the lunch of the [employer]. She took her medicines is normal does not believe she took any extra doses. She's not on any insulin. patient denies any fevers, chills, vomiting, diarrhea, abdominal pain. She did have some mild nausea which is improving. Denies any urinary symptoms. No chest pain or shortness of breath.   Patient is a 55 y.o. female presenting with weakness.  Weakness The primary symptoms include nausea. Primary symptoms do not include headaches, fever or vomiting.  Additional symptoms include weakness.     Past Medical History  Diagnosis Date  . Angina pectoris     negative stress test May 2012 at [facility]  . Asthma   . Acid reflux   . Infections of kidney     As child, no problems since  . Diabetes mellitus   . Stroke   . TIA (transient ischemic attack)     2010  . Depressed   . Thyroid  disease   . Arthritis      Past Surgical History  Procedure Date  . Back surgery   . Cesarean section     x 2  . Shoulder surgery   . Cholecystectomy   . Tonsillectomy     Family History  Problem Relation Age of Onset  . Diabetes Mother   . Heart failure Mother   . Cancer Father     History  Substance Use Topics  . Smoking status: Former Games developer  . Smokeless tobacco: Not on file  . Alcohol Use: No    OB History    Grav Para Term Preterm Abortions TAB SAB Ect Mult Living   7 2              Review of Systems  Constitutional: Positive for diaphoresis. Negative for fever and chills.  HENT: Negative.   Eyes: Negative.  Negative for discharge and redness.  Respiratory: Negative.  Negative for cough and shortness of breath.   Cardiovascular: Positive for palpitations. Negative for chest pain.  Gastrointestinal: Positive for nausea. Negative for vomiting, abdominal pain and diarrhea.  Genitourinary: Negative.  Negative for dysuria and vaginal discharge.  Musculoskeletal: Negative.  Negative for back pain.  Skin: Negative.  Negative for color change and rash.  Neurological: Positive for weakness. Negative for syncope and headaches.  Hematological: Negative.  Negative for adenopathy.  Psychiatric/Behavioral: Positive for confusion.  All other systems reviewed and are negative.    Allergies  Crestor; Darvocet; Ibuprofen; Penicillins; Ultram; and Pravachol  Home Medications   Current Outpatient Rx  Name Route Sig Dispense Refill  . ALBUTEROL SULFATE HFA 108 (90 BASE) MCG/ACT IN AERS Inhalation Inhale 2 puffs into the lungs every 4 (four) hours as needed. For shortness of breath     . ALBUTEROL SULFATE (2.5 MG/3ML) 0.083% IN NEBU Nebulization Take 2.5 mg by nebulization every 6 (six) hours as needed. For shortness of breath    . ASPIRIN 325 MG PO TBEC Oral Take 325 mg by mouth daily.      Marland Kitchen VITAMIN D 2000 UNITS PO TABS Oral Take 2,000 Units by mouth daily.      .  DEXLANSOPRAZOLE 60 MG PO CPDR Oral Take 60 mg by mouth every morning.      Marland Kitchen ESCITALOPRAM OXALATE 10 MG PO TABS Oral Take 10 mg by mouth daily.      Marland Kitchen FLUTICASONE PROPIONATE 50 MCG/ACT NA SUSP Each Nare Place 2 sprays into both nostrils daily.      Marland Kitchen FLUTICASONE PROPIONATE  HFA 110 MCG/ACT IN AERO Inhalation Inhale 2 puffs into the lungs 2 (two) times daily.      Marland Kitchen GABAPENTIN 300 MG PO CAPS Oral Take 300 mg by mouth 2 (two) times daily.     Marland Kitchen GABAPENTIN 300 MG PO CAPS Oral Take 1,200 mg by mouth at bedtime.      Marland Kitchen GLIMEPIRIDE 4 MG PO TABS Oral Take 8 mg by mouth every morning.     Marland Kitchen LEVOTHYROXINE SODIUM 125 MCG PO TABS Oral Take 125 mcg by mouth daily.      Marland Kitchen METFORMIN HCL 1000 MG PO TABS Oral Take 1,000 mg by mouth 2 (two) times daily.      Marland Kitchen METHOCARBAMOL 750 MG PO TABS Oral Take 750 mg by mouth 2 (two) times daily as needed. For muscle spasms    . RALOXIFENE HCL 60 MG PO TABS Oral Take 60 mg by mouth daily.     Marland Kitchen RAMIPRIL 5 MG PO CAPS Oral Take 5 mg by mouth daily.      Marland Kitchen SIMVASTATIN 40 MG PO TABS Oral Take 40 mg by mouth at bedtime.      Marland Kitchen ESOMEPRAZOLE MAGNESIUM 40 MG PO CPDR Oral Take 40 mg by mouth daily before breakfast.      . LEVOTHYROXINE SODIUM 125 MCG PO TABS Oral Take 125 mcg by mouth daily.      Marland Kitchen METFORMIN HCL 1000 MG (OSM) PO TB24 Oral Take 1,000 mg by mouth daily with breakfast.        Physical Exam    BP 135/63  Pulse 80  Temp(Src) 98.2 F (36.8 C) (Oral)  Resp 20  Ht 5\' 1"  (1.549 m)  Wt 268 lb (121.564 kg)  BMI 50.64 kg/m2  SpO2 98%  Physical Exam  Constitutional: She is oriented to person, place, and time. She appears well-developed and well-nourished.  Non-toxic appearance. She does not have a sickly appearance.  HENT:  Head: Normocephalic and atraumatic.  Eyes: Conjunctivae, EOM and lids are normal. Pupils are equal, round, and reactive to light. No scleral icterus.  Neck: Trachea normal and normal range of motion. Neck supple.  Cardiovascular: Normal rate,  regular rhythm and normal heart sounds.   Pulmonary/Chest: Effort normal and breath sounds normal. No respiratory distress. She has no wheezes. She has no rales. She exhibits no tenderness.  Abdominal: Soft. Normal appearance. There is no tenderness. There is no rebound, no guarding and no CVA tenderness.  Musculoskeletal: Normal range of motion.  Neurological: She is alert and oriented to person, place, and time. She has normal strength.  Skin: Skin is warm, dry and intact. No rash noted.  Psychiatric: She has a normal mood and affect. Her behavior is normal. Judgment and thought content normal.    ED Course  Procedures  Results for orders placed during the hospital encounter of 03/11/11  CBC      Component Value Range   WBC 8.0  4.0 - 10.5 (K/uL)   RBC 4.00  3.87 - 5.11 (MIL/uL)   Hemoglobin 11.8 (*) 12.0 - 15.0 (g/dL)   HCT 16.1  09.6 - 04.5 (%)   MCV 90.5  78.0 - 100.0 (fL)   MCH 29.5  26.0 - 34.0 (pg)   MCHC 32.6  30.0 - 36.0 (g/dL)   RDW 40.9  81.1 - 91.4 (%)   Platelets 198  150 - 400 (K/uL)  BASIC METABOLIC PANEL      Component Value Range   Sodium 140  135 - 145 (mEq/L)   Potassium 4.4  3.5 - 5.1 (mEq/L)   Chloride 102  96 - 112 (mEq/L)   CO2 24  19 - 32 (mEq/L)   Glucose, Bld 259 (*) 70 - 99 (mg/dL)   BUN 15  6 - 23 (mg/dL)   Creatinine, Ser 7.82  0.50 - 1.10 (mg/dL)   Calcium 9.6  8.4 - 95.6 (mg/dL)   GFR calc non Af Amer 57 (*) >60 (mL/min)   GFR calc Af Amer >60  >60 (mL/min)  URINALYSIS, ROUTINE W REFLEX MICROSCOPIC      Component Value Range   Color, Urine YELLOW  YELLOW    Appearance CLEAR  CLEAR    Specific Gravity, Urine 1.006  1.005 - 1.030    pH 6.0  5.0 - 8.0    Glucose, UA NEGATIVE  NEGATIVE (mg/dL)   Hgb urine dipstick NEGATIVE  NEGATIVE    Bilirubin Urine NEGATIVE  NEGATIVE    Ketones, ur NEGATIVE  NEGATIVE (mg/dL)   Protein, ur NEGATIVE  NEGATIVE (mg/dL)   Urobilinogen, UA 0.2  0.0 - 1.0 (mg/dL)   Nitrite NEGATIVE  NEGATIVE    Leukocytes, UA  NEGATIVE  NEGATIVE   GLUCOSE, CAPILLARY      Component Value Range   Glucose-Capillary 250 (*) 70 - 99 (mg/dL)       MDM Patient with one episode of hypoglycemia which would account for her altered mental status that her son noted at home. Her mental status is totally resolved at this point in time. She has not had any further episodes of hypoglycemia. As patient is able to take by mouth intake and feels well and shows no signs of infection as a cause for her episode of hypoglycemia I feel she is safe for discharge home. The son also lives with her and will be able to check on her this evening.       Nat Christen, MD 03/11/11 [phone]

## 2011-06-09 ENCOUNTER — Emergency Department (INDEPENDENT_AMBULATORY_CARE_PROVIDER_SITE_OTHER): Payer: Medicare PPO

## 2011-06-09 ENCOUNTER — Emergency Department (HOSPITAL_BASED_OUTPATIENT_CLINIC_OR_DEPARTMENT_OTHER)
Admission: EM | Admit: 2011-06-09 | Discharge: 2011-06-09 | Disposition: A | Discharge status: Home / Self Care | Payer: Medicare PPO | Attending: Emergency Medicine | Admitting: Emergency Medicine

## 2011-06-09 ENCOUNTER — Encounter (HOSPITAL_BASED_OUTPATIENT_CLINIC_OR_DEPARTMENT_OTHER): Payer: Self-pay | Admitting: *Deleted

## 2011-06-09 DIAGNOSIS — R109 Unspecified abdominal pain: Secondary | ICD-10-CM | POA: Insufficient documentation

## 2011-06-09 DIAGNOSIS — Z8679 Personal history of other diseases of the circulatory system: Secondary | ICD-10-CM | POA: Insufficient documentation

## 2011-06-09 DIAGNOSIS — J45909 Unspecified asthma, uncomplicated: Secondary | ICD-10-CM | POA: Insufficient documentation

## 2011-06-09 DIAGNOSIS — E119 Type 2 diabetes mellitus without complications: Secondary | ICD-10-CM | POA: Insufficient documentation

## 2011-06-09 DIAGNOSIS — R0789 Other chest pain: Secondary | ICD-10-CM

## 2011-06-09 DIAGNOSIS — Z79899 Other long term (current) drug therapy: Secondary | ICD-10-CM | POA: Insufficient documentation

## 2011-06-09 LAB — URINALYSIS, ROUTINE W REFLEX MICROSCOPIC
Nitrite: NEGATIVE
Protein, ur: NEGATIVE mg/dL
Specific Gravity, Urine: 1.009 (ref 1.005–1.030)
Urobilinogen, UA: 0.2 mg/dL (ref 0.0–1.0)

## 2011-06-09 LAB — COMPREHENSIVE METABOLIC PANEL
ALT: 16 U/L (ref 0–35)
AST: 18 U/L (ref 0–37)
Alkaline Phosphatase: 49 U/L (ref 39–117)
Calcium: 9.4 mg/dL (ref 8.4–10.5)
GFR calc Af Amer: 71 mL/min — ABNORMAL LOW (ref >90)
Glucose, Bld: 140 mg/dL — ABNORMAL HIGH (ref 70–99)
Potassium: 4.3 mEq/L (ref 3.5–5.1)
Sodium: 139 mEq/L (ref 135–145)
Total Protein: 7.6 g/dL (ref 6.0–8.3)

## 2011-06-09 LAB — CBC
MCH: 29.4 pg (ref 26.0–34.0)
Platelets: 203 10*3/uL (ref 150–400)
RBC: 4.05 MIL/uL (ref 3.87–5.11)
RDW: 15 % (ref 11.5–15.5)
WBC: 6.8 10*3/uL (ref 4.0–10.5)

## 2011-06-09 LAB — DIFFERENTIAL
Basophils Absolute: 0 10*3/uL (ref 0.0–0.1)
Eosinophils Absolute: 0.1 10*3/uL (ref 0.0–0.7)
Lymphocytes Relative: 46 % (ref 12–46)
Lymphs Abs: 3.2 10*3/uL (ref 0.7–4.0)
Neutrophils Relative %: 44 % (ref 43–77)

## 2011-06-09 LAB — LIPASE, BLOOD: Lipase: 85 U/L — ABNORMAL HIGH (ref 11–59)

## 2011-06-09 LAB — URINE MICROSCOPIC-ADD ON

## 2011-06-09 LAB — CARDIAC PANEL(CRET KIN+CKTOT+MB+TROPI)
Relative Index: INVALID (ref 0.0–2.5)
Troponin I: <0.3 ng/mL (ref <0.30)

## 2011-06-09 IMAGING — CT CT ABD-PELV W/O CM
2 of 4 series · 17 of 46 positions shown, 19 images · non-contrast
Comparison: [DATE]

CLINICAL DATA: Left-sided pain. Left flank pain for 3 days.

CT ABDOMEN AND PELVIS WITHOUT CONTRAST
TECHNIQUE: Multidetector CT imaging of the abdomen and pelvis was
performed following the standard protocol without intravenous
contrast.

[Series 2: renal stone > 200 lbs 5.0 b31f · axial · 0.85mm/px · z∈[-636,-226]mm · 14 of 90 slices shown, 16 images]
[im 4/90  soft-tissue]
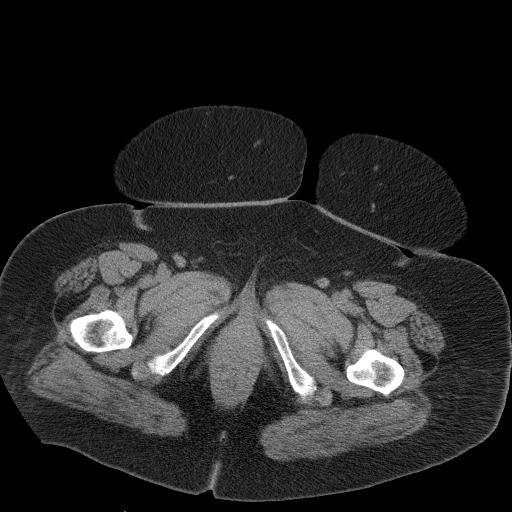
[im 4/90  bone]
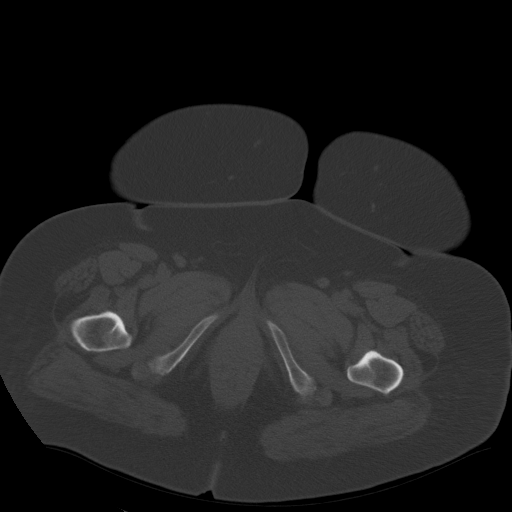
[im 12/90  soft-tissue]
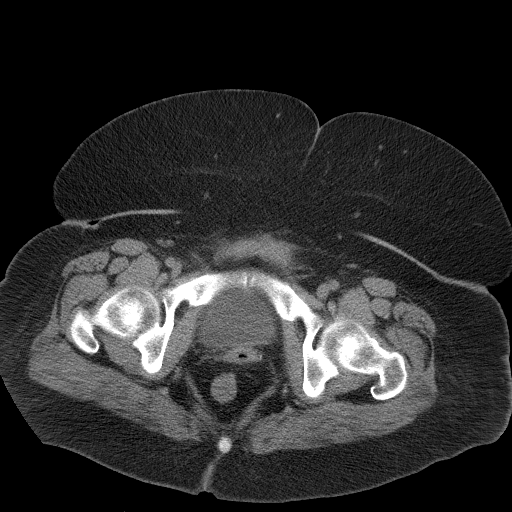
[im 19/90  soft-tissue]
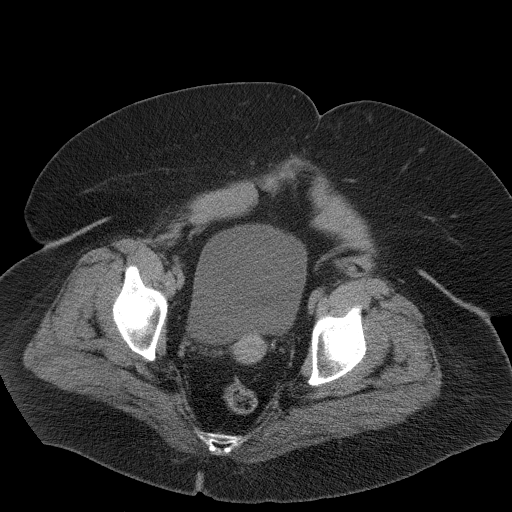
[im 23/90  soft-tissue]
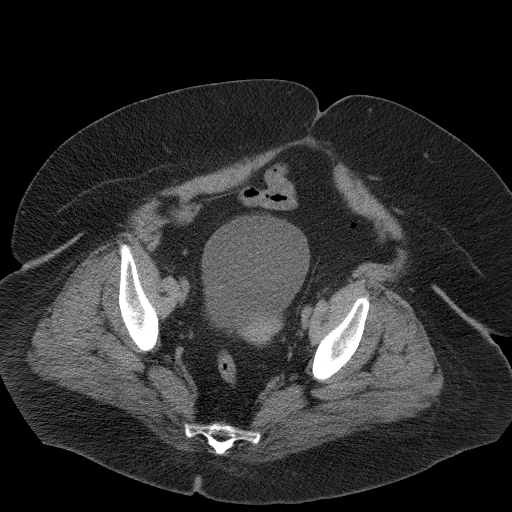
[im 30/90  soft-tissue]
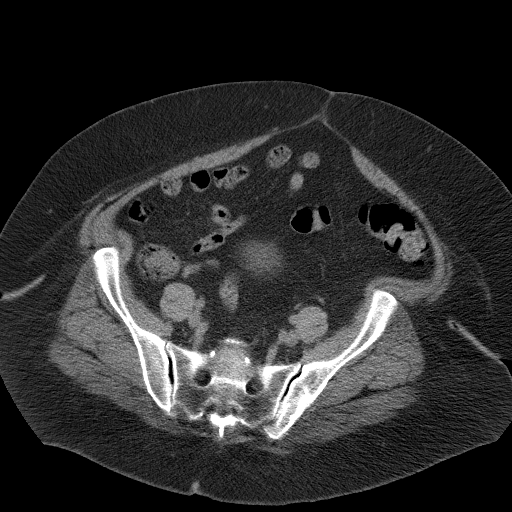
[im 38/90  soft-tissue]
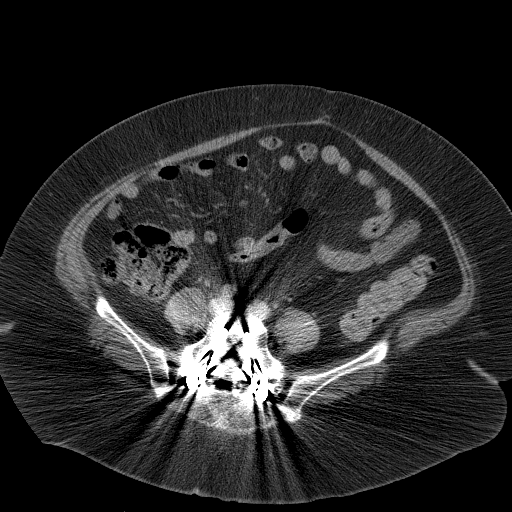
[im 41/90  soft-tissue]
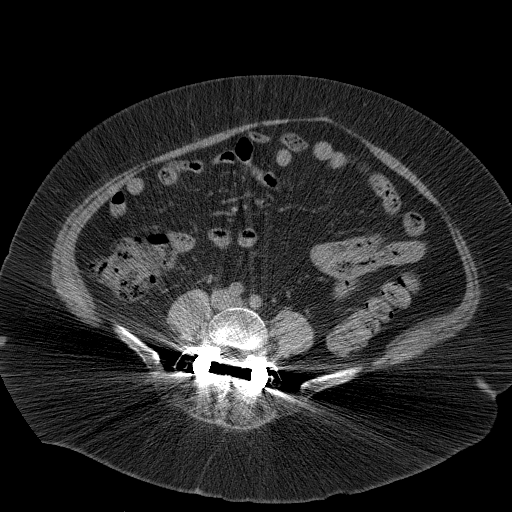
[im 49/90  soft-tissue]
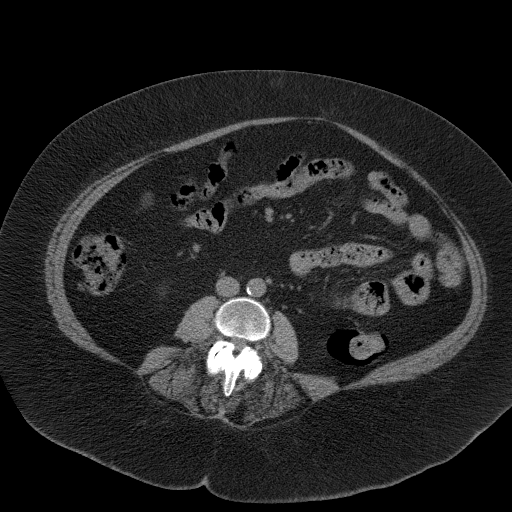
[im 52/90  soft-tissue]
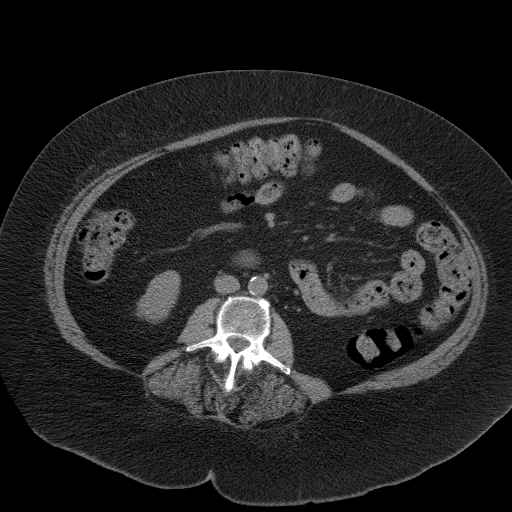
[im 52/90  bone]
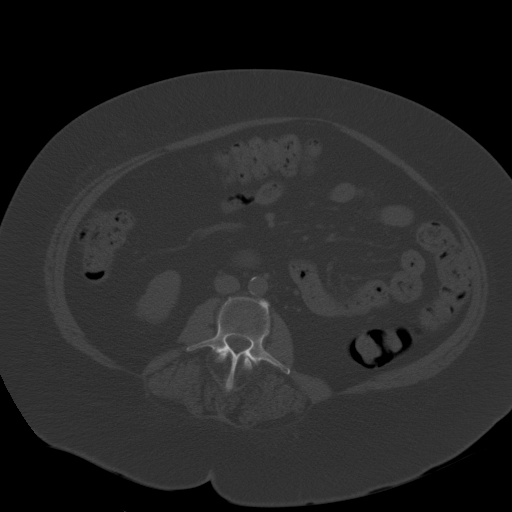
[im 60/90  soft-tissue]
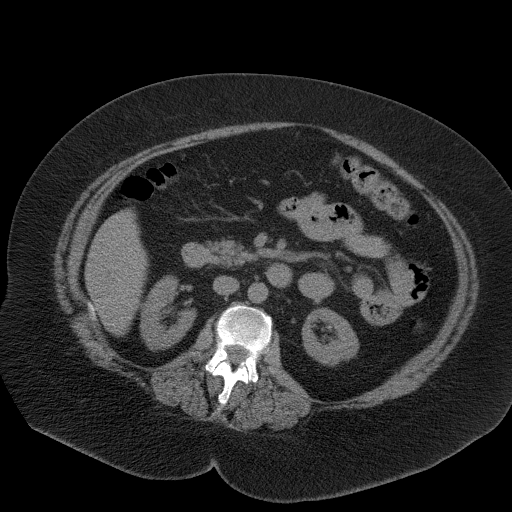
[im 67/90  soft-tissue]
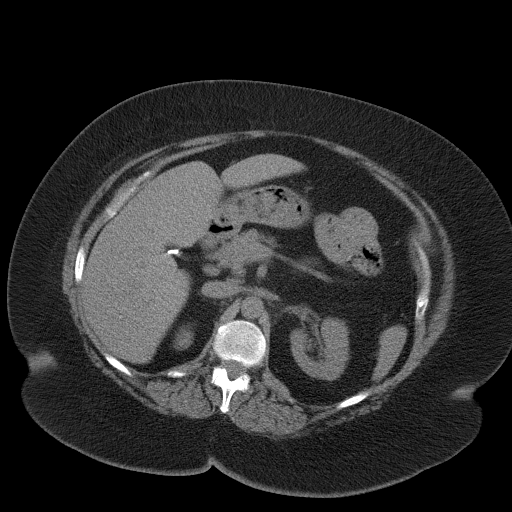
[im 71/90  soft-tissue]
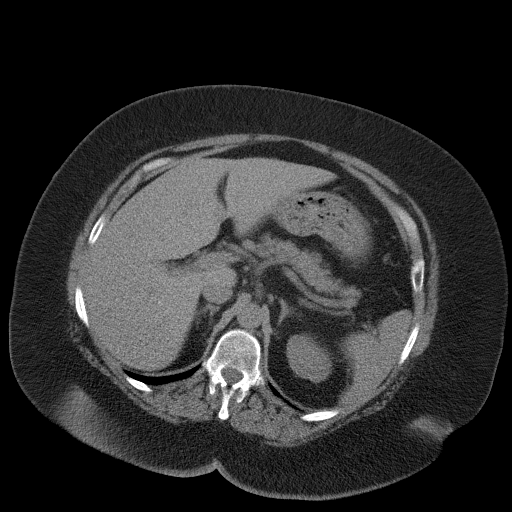
[im 78/90  soft-tissue]
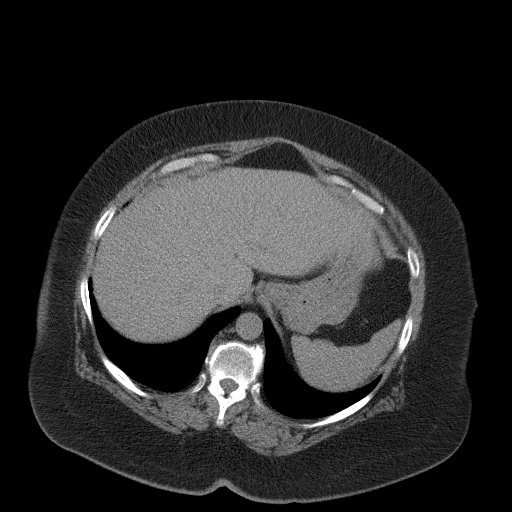
[im 86/90  soft-tissue]
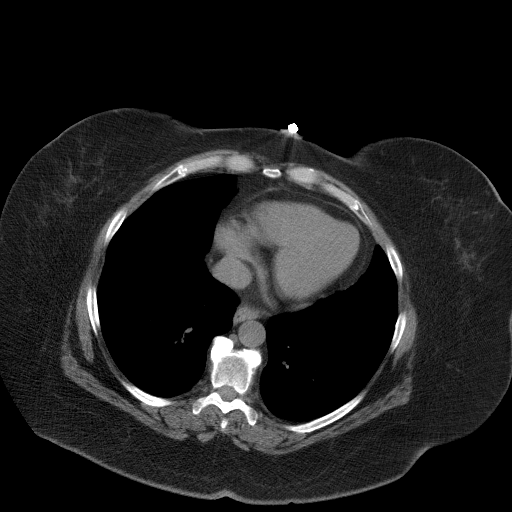

[Series 5: renal stone 3.0 coronal · coronal · 0.89mm/px · 3 of 114 slices shown]
[im 38/114  soft-tissue]
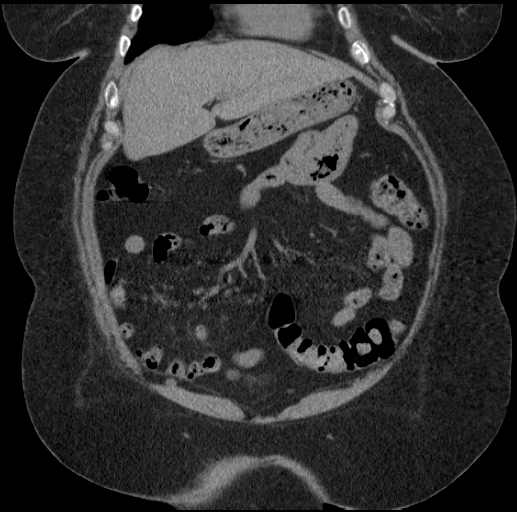
[im 51/114  soft-tissue]
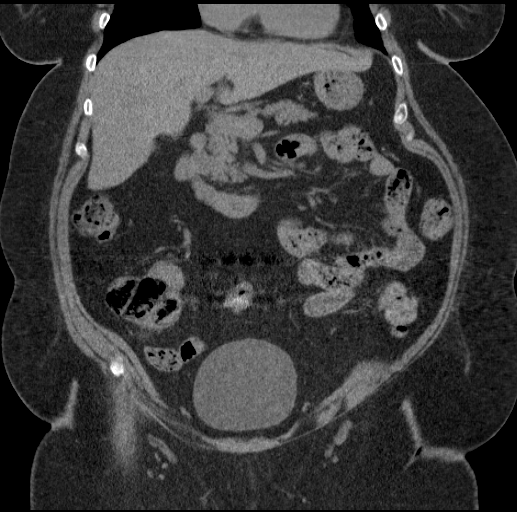
[im 63/114  soft-tissue]
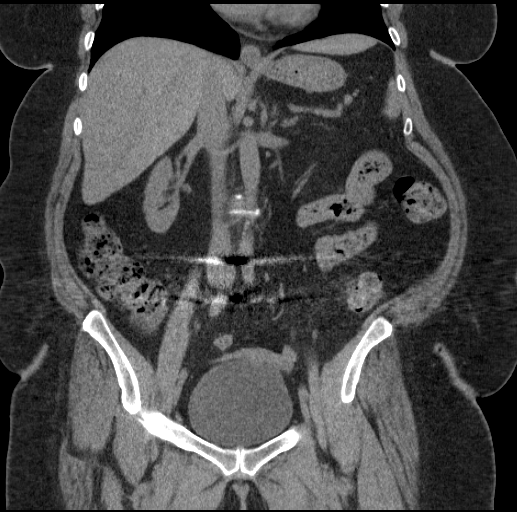

[17 of 46 positions shown; findings below may reference images not displayed]

FINDINGS: Liver and spleen have normal uninfused features.  The
stomach, duodenum, pancreas, and adrenal glands are unremarkable.
Gallbladder is surgically absent.  No evidence for stones or
secondary changes in either kidney.  There is no evidence for a
ureteral or bladder stone.

No abdominal aortic aneurysm.  No free fluid or lymphadenopathy in
the abdomen.

Imaging through the pelvis shows no free intraperitoneal fluid.
There is no pelvic sidewall lymphadenopathy.  Uterus is normal.
There is no adnexal mass.  No colonic diverticulitis without
substantial diverticulosis evident.  Terminal ileum is normal. The
appendix is not visualized, but there is no edema or inflammation
in the region of the cecum.

Bone windows reveal no worrisome lytic or sclerotic osseous
lesions.
IMPRESSION: No acute findings.  Specifically, no evidence for urinary stones to
account for the patient's flank pain.

## 2011-06-09 MED ORDER — OXYCODONE-ACETAMINOPHEN 5-325 MG PO TABS: 2.0000 | ORAL_TABLET | ORAL | Status: AC | PRN

## 2011-06-09 MED ORDER — MORPHINE SULFATE 4 MG/ML IJ SOLN
4.0000 mg | Freq: Once | INTRAMUSCULAR | Status: AC
  Administered 2011-06-09: 4 mg via INTRAVENOUS
  Filled 2011-06-09: qty 1

## 2011-06-09 NOTE — ED Notes (Signed)
Here via ems for 3 day history of flank pain

## 2011-06-09 NOTE — ED Provider Notes (Addendum)
History     CSN: [id number] Arrival date & time: 06/09/2011  5:22 PM   First MD Initiated Contact with Patient 06/09/11 1733      Chief Complaint  Patient presents with  . Flank Pain   55 year old female with multiple medical problems including asthma, acid reflux, diabetes, angina, and a normal stress test in May of 2012 at [facility] according to her own history. She has had a constant pain in her left upper abdomen and left flank for 3 days. She is taking Tylenol with minimal relief. She has had no vomiting, no fever, some coughing at night, but no new cough. She denies any anterior chest pain. She's had no dizziness. No numbness or tingling. She had no nausea until she called her nurse at home today. Patient denies any urinary symptoms, such as dysuria or hematuria. She's had no recent injuries, however, she feels the pain is much worse with certain movements and with palpation. There is no exertional component.  (Consider location/radiation/quality/duration/timing/severity/associated sxs/prior treatment) HPI  Past Medical History  Diagnosis Date  . Angina pectoris     negative stress test May 2012 at [facility]  . Asthma   . Acid reflux   . Infections of kidney     As child, no problems since  . Diabetes mellitus   . Stroke   . TIA (transient ischemic attack)     2010  . Depressed   . Thyroid disease   . Arthritis     Past Surgical History  Procedure Date  . Back surgery   . Cesarean section     x 2  . Shoulder surgery   . Cholecystectomy   . Tonsillectomy     Family History  Problem Relation Age of Onset  . Diabetes Mother   . Heart failure Mother   . Cancer Father     History  Substance Use Topics  . Smoking status: Former Games developer  . Smokeless tobacco: Not on file  . Alcohol Use: No    OB History    Grav Para Term Preterm Abortions TAB SAB Ect Mult Living   7 2              Review of Systems  All other systems reviewed and are  negative.    Allergies  Crestor; Darvocet; Ibuprofen; Penicillins; Ultram; and Pravachol  Home Medications   Current Outpatient Rx  Name Route Sig Dispense Refill  . ASPIRIN 325 MG PO TBEC Oral Take 325 mg by mouth daily.      . ATORVASTATIN CALCIUM 40 MG PO TABS Oral Take 40 mg by mouth daily.      Marland Kitchen VITAMIN D 2000 UNITS PO TABS Oral Take 2,000 Units by mouth daily.      Marland Kitchen ESCITALOPRAM OXALATE 10 MG PO TABS Oral Take 10 mg by mouth daily.      Marland Kitchen GABAPENTIN 300 MG PO CAPS Oral Take 300 mg by mouth 2 (two) times daily.     Marland Kitchen GABAPENTIN 300 MG PO CAPS Oral Take 1,200 mg by mouth at bedtime.      Marland Kitchen LEVOTHYROXINE SODIUM 125 MCG PO TABS Oral Take 125 mcg by mouth daily.      Marland Kitchen METFORMIN HCL 1000 MG PO TABS Oral Take 1,000 mg by mouth 2 (two) times daily.      Marland Kitchen RALOXIFENE HCL 60 MG PO TABS Oral Take 60 mg by mouth daily.     Marland Kitchen RAMIPRIL 5 MG PO CAPS  Oral Take 5 mg by mouth daily.      Marland Kitchen SITAGLIPTIN PHOSPHATE 100 MG PO TABS Oral Take 100 mg by mouth daily.      . ALBUTEROL SULFATE HFA 108 (90 BASE) MCG/ACT IN AERS Inhalation Inhale 2 puffs into the lungs every 4 (four) hours as needed. For shortness of breath     . ALBUTEROL SULFATE (2.5 MG/3ML) 0.083% IN NEBU Nebulization Take 2.5 mg by nebulization every 6 (six) hours as needed. For shortness of breath    . DEXLANSOPRAZOLE 60 MG PO CPDR Oral Take 60 mg by mouth every morning.      Marland Kitchen ESOMEPRAZOLE MAGNESIUM 40 MG PO CPDR Oral Take 40 mg by mouth daily before breakfast.      . FLUTICASONE PROPIONATE 50 MCG/ACT NA SUSP Each Nare Place 2 sprays into both nostrils daily.      Marland Kitchen FLUTICASONE PROPIONATE  HFA 110 MCG/ACT IN AERO Inhalation Inhale 2 puffs into the lungs 2 (two) times daily.      Marland Kitchen GLIMEPIRIDE 4 MG PO TABS Oral Take 8 mg by mouth every morning.     Marland Kitchen METHOCARBAMOL 750 MG PO TABS Oral Take 750 mg by mouth 2 (two) times daily as needed. For muscle spasms      BP 133/52  Pulse 67  Temp(Src) 98 F (36.7 C) (Oral)  Resp 18  Ht 5\' 1"   (1.549 m)  Wt 270 lb (122.471 kg)  BMI 51.02 kg/m2  SpO2 96%  Physical Exam  Nursing note and vitals reviewed. Constitutional: She is oriented to person, place, and time. She appears well-developed and well-nourished.       Obese, NAD, no diaphoresis  HENT:  Head: Normocephalic and atraumatic.  Eyes: Conjunctivae and EOM are normal. Pupils are equal, round, and reactive to light.  Neck: Neck supple.  Cardiovascular: Normal rate and regular rhythm.  Exam reveals no gallop and no friction rub.   No murmur heard. Pulmonary/Chest: Breath sounds normal. She has no wheezes. She has no rales. She exhibits no tenderness.  Abdominal: Soft. Bowel sounds are normal. She exhibits no distension. There is tenderness. There is no rebound and no guarding.       Diffuse tenderness in left upper abdomen, left flank, and some tenderness also elicited in the left lower chest wall. No bruising or redness.  Musculoskeletal: Normal range of motion. She exhibits no edema and no tenderness.  Neurological: She is alert and oriented to person, place, and time. No cranial nerve deficit. Coordination normal.  Skin: Skin is warm and dry. No rash noted.  Psychiatric: She has a normal mood and affect.    ED Course  Procedures (including critical care time)   Labs Reviewed  CBC  DIFFERENTIAL  COMPREHENSIVE METABOLIC PANEL  CARDIAC PANEL(CRET KIN+CKTOT+MB+TROPI)  LIPASE, BLOOD   No results found.   No diagnosis found.    MDM   Date: 06/09/2011  Rate: 67  Rhythm: normal sinus rhythm  QRS Axis: normal  Intervals: normal  ST/T Wave abnormalities: normal  Conduction Disutrbances:none  Narrative Interpretation:   Old EKG Reviewed: none available        6:53 PM Results for orders placed during the hospital encounter of 06/09/11  CBC      Component Value Range   WBC 6.8  4.0 - 10.5 (K/uL)   RBC 4.05  3.87 - 5.11 (MIL/uL)   Hemoglobin 11.9 (*) 12.0 - 15.0 (g/dL)   HCT 16.1  09.6 - 04.5 (%)    MCV  88.9  78.0 - 100.0 (fL)   MCH 29.4  26.0 - 34.0 (pg)   MCHC 33.1  30.0 - 36.0 (g/dL)   RDW 02.7  25.3 - 66.4 (%)   Platelets 203  150 - 400 (K/uL)  DIFFERENTIAL      Component Value Range   Neutrophils Relative 44  43 - 77 (%)   Neutro Abs 3.0  1.7 - 7.7 (K/uL)   Lymphocytes Relative 46  12 - 46 (%)   Lymphs Abs 3.2  0.7 - 4.0 (K/uL)   Monocytes Relative 8  3 - 12 (%)   Monocytes Absolute 0.5  0.1 - 1.0 (K/uL)   Eosinophils Relative 2  0 - 5 (%)   Eosinophils Absolute 0.1  0.0 - 0.7 (K/uL)   Basophils Relative 0  0 - 1 (%)   Basophils Absolute 0.0  0.0 - 0.1 (K/uL)  COMPREHENSIVE METABOLIC PANEL      Component Value Range   Sodium 139  135 - 145 (mEq/L)   Potassium 4.3  3.5 - 5.1 (mEq/L)   Chloride 101  96 - 112 (mEq/L)   CO2 26  19 - 32 (mEq/L)   Glucose, Bld 140 (*) 70 - 99 (mg/dL)   BUN 15  6 - 23 (mg/dL)   Creatinine, Ser 4.03  0.50 - 1.10 (mg/dL)   Calcium 9.4  8.4 - 47.4 (mg/dL)   Total Protein 7.6  6.0 - 8.3 (g/dL)   Albumin 3.7  3.5 - 5.2 (g/dL)   AST 18  0 - 37 (U/L)   ALT 16  0 - 35 (U/L)   Alkaline Phosphatase 49  39 - 117 (U/L)   Total Bilirubin 0.2 (*) 0.3 - 1.2 (mg/dL)   GFR calc non Af Amer 61 (*) >90 (mL/min)   GFR calc Af Amer 71 (*) >90 (mL/min)  CARDIAC PANEL(CRET KIN+CKTOT+MB+TROPI)      Component Value Range   Total CK 64  7 - 177 (U/L)   CK, MB 1.1  0.3 - 4.0 (ng/mL)   Troponin I <0.30  <0.30 (ng/mL)   Relative Index RELATIVE INDEX IS INVALID  0.0 - 2.5   LIPASE, BLOOD      Component Value Range   Lipase 85 (*) 11 - 59 (U/L)   Ct Abdomen Pelvis Wo Contrast  06/09/2011  *RADIOLOGY REPORT*  Clinical Data: Left-sided pain. Left flank pain for 3 days.  CT ABDOMEN AND PELVIS WITHOUT CONTRAST  Technique:  Multidetector CT imaging of the abdomen and pelvis was performed following the standard protocol without intravenous contrast.  Comparison: 09/05/2010  Findings: Liver and spleen have normal uninfused features.  The stomach, duodenum, pancreas, and  adrenal glands are unremarkable. Gallbladder is surgically absent.  No evidence for stones or secondary changes in either kidney.  There is no evidence for a ureteral or bladder stone.  No abdominal aortic aneurysm.  No free fluid or lymphadenopathy in the abdomen.  Imaging through the pelvis shows no free intraperitoneal fluid. There is no pelvic sidewall lymphadenopathy.  Uterus is normal. There is no adnexal mass.  No colonic diverticulitis without substantial diverticulosis evident.  Terminal ileum is normal. The appendix is not visualized, but there is no edema or inflammation in the region of the cecum.  Bone windows reveal no worrisome lytic or sclerotic osseous lesions.  IMPRESSION: No acute findings.  Specifically, no evidence for urinary stones to account for the patient's flank pain.  Original Report Authenticated By: ERIC A. MANSELL, M.D.   Dg  Chest 2 View  06/09/2011  *RADIOLOGY REPORT*  Clinical Data: Left-sided chest pain  CHEST - 2 VIEW  Comparison: 11/14/2010  Findings: Heart size is normal.  Mediastinal shadows are normal. Lungs are clear.  The vascularity is normal.  No effusions. Ordinary mild degenerative changes effect the spine.  IMPRESSION: No active disease  Original Report Authenticated By: Thomasenia Sales, M.D.    7:57 PM Stable, improved.  Initial studies negative.  Cardiac markers negative. Slight increase in Lipase.   Will be stable for outpt f/u. Instructions given.   Renie Stelmach A. Patrica Duel, MD 06/09/11 1958  Lorelle Gibbs. Patrica Duel, MD 06/09/11 5621

## 2011-06-09 NOTE — ED Notes (Addendum)
Attempted IV access x2 without success. Another RN to attempt.

## 2012-01-05 ENCOUNTER — Encounter (HOSPITAL_BASED_OUTPATIENT_CLINIC_OR_DEPARTMENT_OTHER): Payer: Self-pay | Admitting: *Deleted

## 2012-01-05 ENCOUNTER — Emergency Department (HOSPITAL_BASED_OUTPATIENT_CLINIC_OR_DEPARTMENT_OTHER)
Admission: EM | Admit: 2012-01-05 | Discharge: 2012-01-05 | Disposition: A | Discharge status: Home / Self Care | Payer: Medicare PPO | Attending: Emergency Medicine | Admitting: Emergency Medicine

## 2012-01-05 ENCOUNTER — Emergency Department (HOSPITAL_BASED_OUTPATIENT_CLINIC_OR_DEPARTMENT_OTHER): Payer: Medicare PPO

## 2012-01-05 DIAGNOSIS — Z79899 Other long term (current) drug therapy: Secondary | ICD-10-CM | POA: Insufficient documentation

## 2012-01-05 DIAGNOSIS — N390 Urinary tract infection, site not specified: Secondary | ICD-10-CM | POA: Insufficient documentation

## 2012-01-05 DIAGNOSIS — J45909 Unspecified asthma, uncomplicated: Secondary | ICD-10-CM | POA: Insufficient documentation

## 2012-01-05 DIAGNOSIS — Z8739 Personal history of other diseases of the musculoskeletal system and connective tissue: Secondary | ICD-10-CM | POA: Insufficient documentation

## 2012-01-05 DIAGNOSIS — E119 Type 2 diabetes mellitus without complications: Secondary | ICD-10-CM | POA: Insufficient documentation

## 2012-01-05 DIAGNOSIS — K219 Gastro-esophageal reflux disease without esophagitis: Secondary | ICD-10-CM | POA: Insufficient documentation

## 2012-01-05 DIAGNOSIS — Z8673 Personal history of transient ischemic attack (TIA), and cerebral infarction without residual deficits: Secondary | ICD-10-CM | POA: Insufficient documentation

## 2012-01-05 DIAGNOSIS — E079 Disorder of thyroid, unspecified: Secondary | ICD-10-CM | POA: Insufficient documentation

## 2012-01-05 DIAGNOSIS — K859 Acute pancreatitis without necrosis or infection, unspecified: Secondary | ICD-10-CM | POA: Insufficient documentation

## 2012-01-05 LAB — URINALYSIS, ROUTINE W REFLEX MICROSCOPIC
Ketones, ur: NEGATIVE mg/dL
Nitrite: NEGATIVE
Protein, ur: NEGATIVE mg/dL
Urobilinogen, UA: 0.2 mg/dL (ref 0.0–1.0)
pH: 5.5 (ref 5.0–8.0)

## 2012-01-05 LAB — TROPONIN I: Troponin I: <0.3 ng/mL (ref <0.30)

## 2012-01-05 LAB — CBC WITH DIFFERENTIAL/PLATELET
Basophils Relative: 0 % (ref 0–1)
Eosinophils Absolute: 0.1 10*3/uL (ref 0.0–0.7)
Eosinophils Relative: 2 % (ref 0–5)
HCT: 35.6 % — ABNORMAL LOW (ref 36.0–46.0)
Hemoglobin: 12 g/dL (ref 12.0–15.0)
MCH: 30.5 pg (ref 26.0–34.0)
MCHC: 33.7 g/dL (ref 30.0–36.0)
MCV: 90.4 fL (ref 78.0–100.0)
Monocytes Absolute: 0.6 10*3/uL (ref 0.1–1.0)
Monocytes Relative: 9 % (ref 3–12)

## 2012-01-05 LAB — COMPREHENSIVE METABOLIC PANEL
Albumin: 3.6 g/dL (ref 3.5–5.2)
BUN: 18 mg/dL (ref 6–23)
Creatinine, Ser: 0.9 mg/dL (ref 0.50–1.10)
GFR calc Af Amer: 81 mL/min — ABNORMAL LOW (ref >90)
Glucose, Bld: 148 mg/dL — ABNORMAL HIGH (ref 70–99)
Total Protein: 8 g/dL (ref 6.0–8.3)

## 2012-01-05 LAB — URINE MICROSCOPIC-ADD ON

## 2012-01-05 LAB — LIPASE, BLOOD: Lipase: 67 U/L — ABNORMAL HIGH (ref 11–59)

## 2012-01-05 IMAGING — CT CT ABD-PELV W/ CM
2 of 5 series · 18 of 46 positions shown, 20 images · IV contrast (APPLIED)
Comparison: [DATE]

CLINICAL DATA: Abdominal pain

CT ABDOMEN AND PELVIS WITH CONTRAST
TECHNIQUE: Multidetector CT imaging of the abdomen and pelvis was
performed following the standard protocol during bolus
administration of intravenous contrast.
Contrast: 20mL OMNIPAQUE IOHEXOL 300 MG/ML  SOLN, 100mL OMNIPAQUE
IOHEXOL 300 MG/ML  SOLN

[Series 2: abd/pelvis 5.0 b31f · axial · 0.96mm/px · z∈[-648,-228]mm · 15 of 96 slices shown, 17 images]
[im 6/96  soft-tissue]
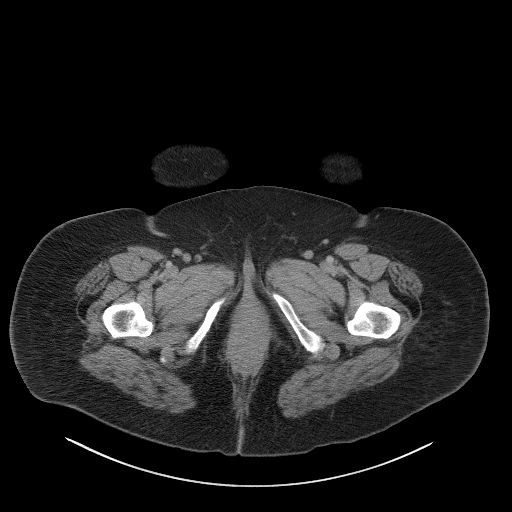
[im 6/96  bone]
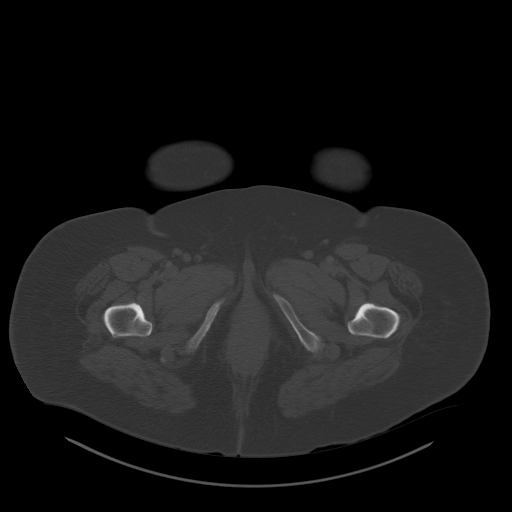
[im 11/96  soft-tissue]
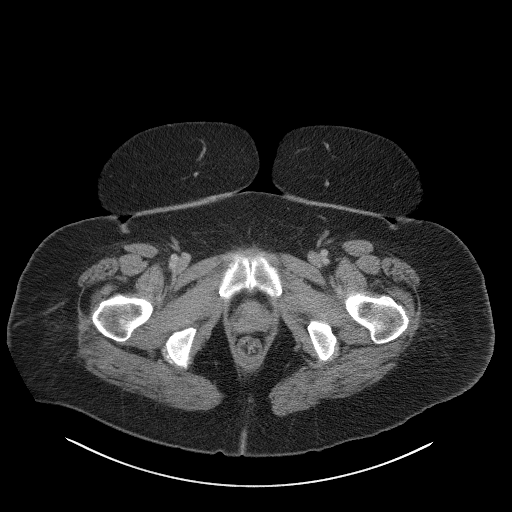
[im 16/96  soft-tissue]
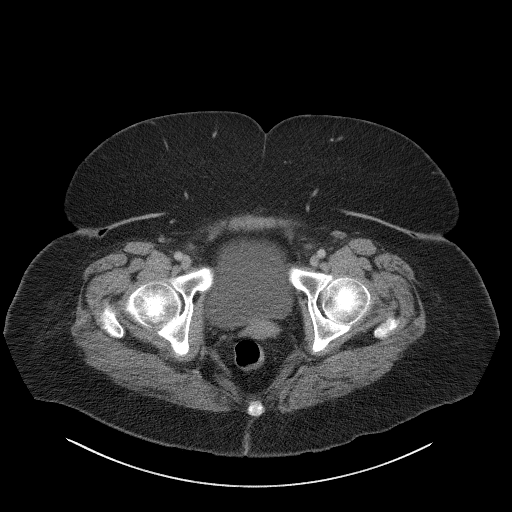
[im 22/96  soft-tissue]
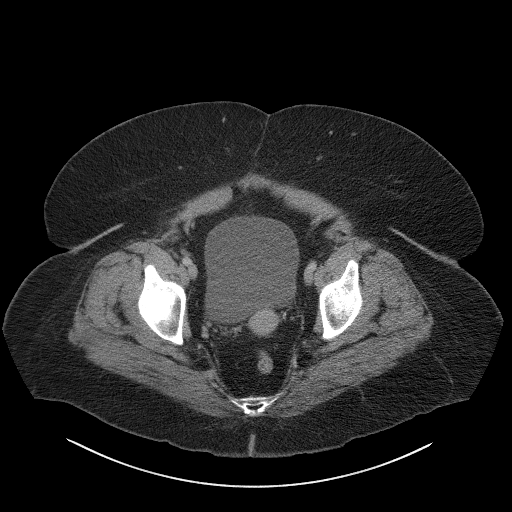
[im 32/96  soft-tissue]
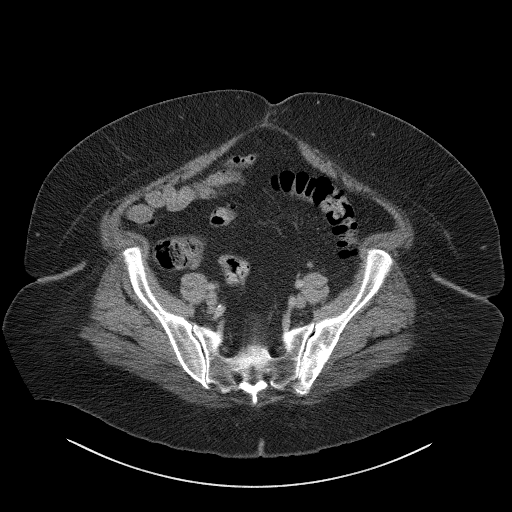
[im 37/96  soft-tissue]
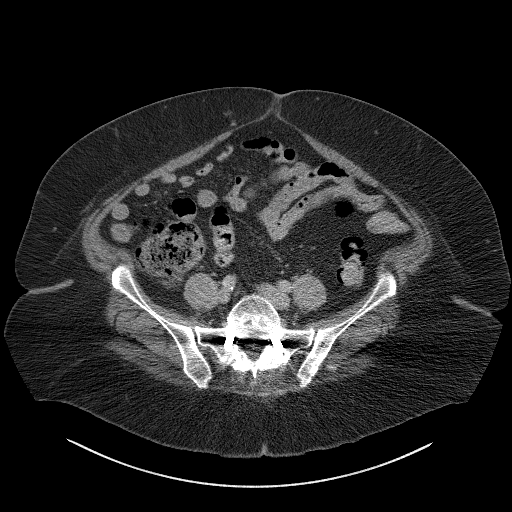
[im 43/96  soft-tissue]
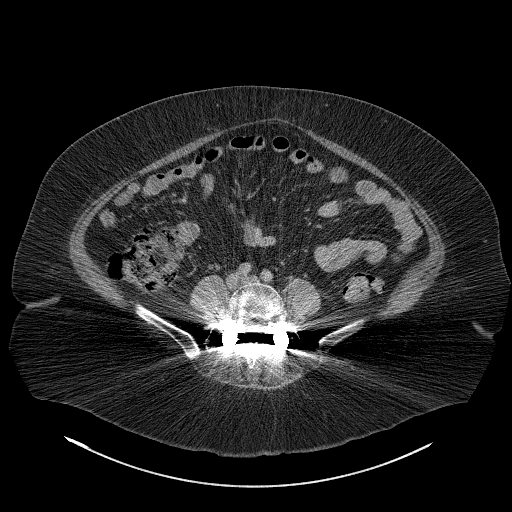
[im 48/96  soft-tissue]
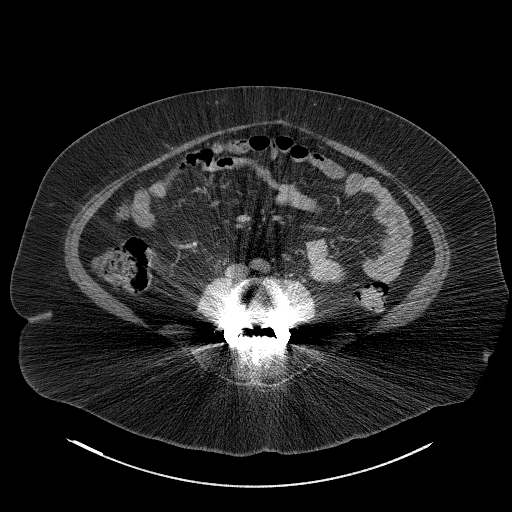
[im 53/96  soft-tissue]
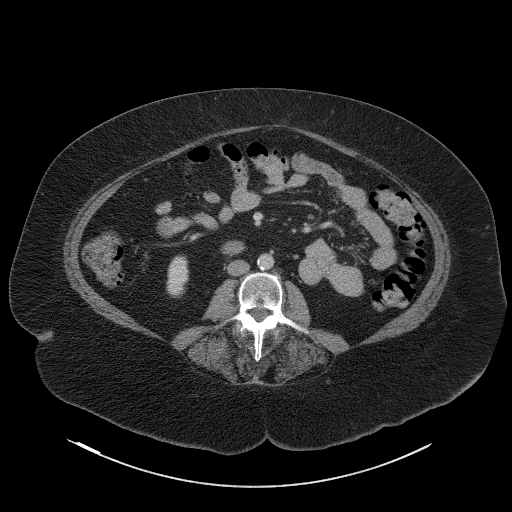
[im 53/96  bone]
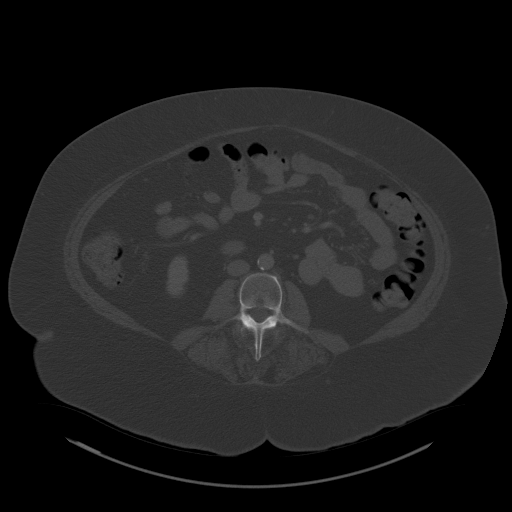
[im 59/96  soft-tissue]
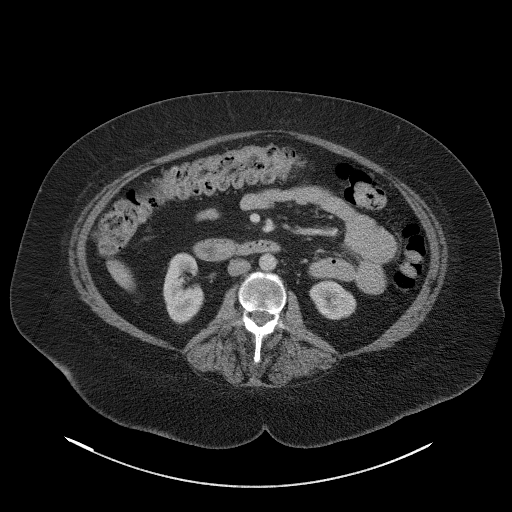
[im 64/96  soft-tissue]
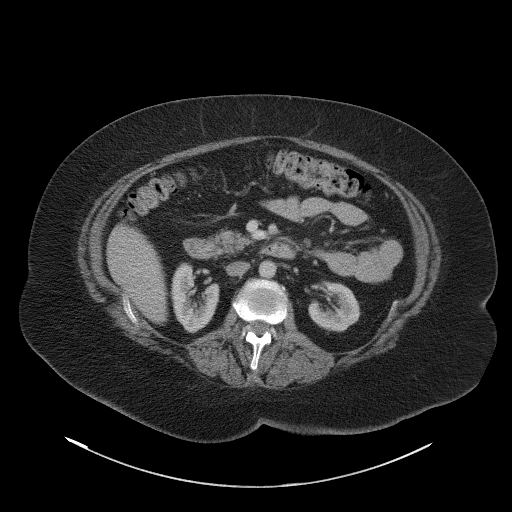
[im 74/96  soft-tissue]
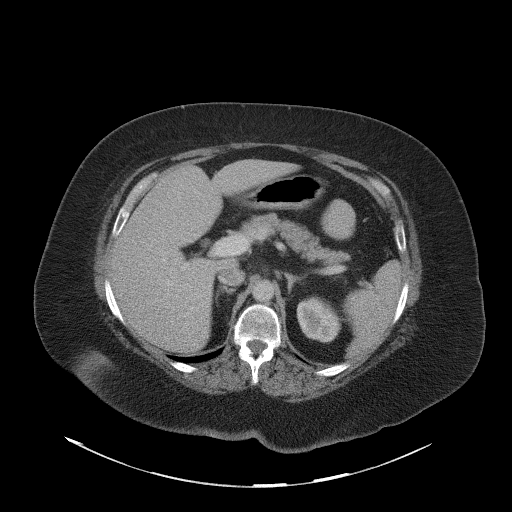
[im 80/96  soft-tissue]
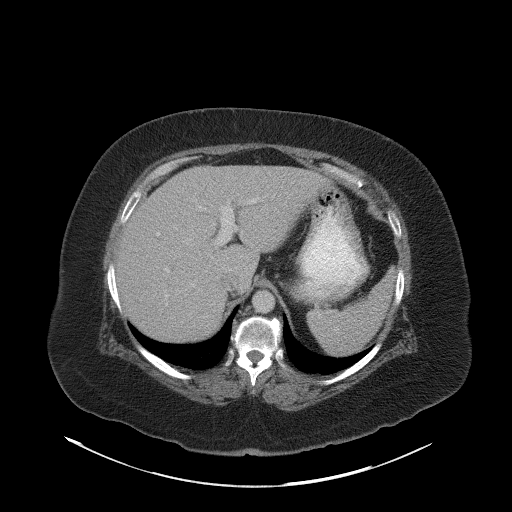
[im 85/96  soft-tissue]
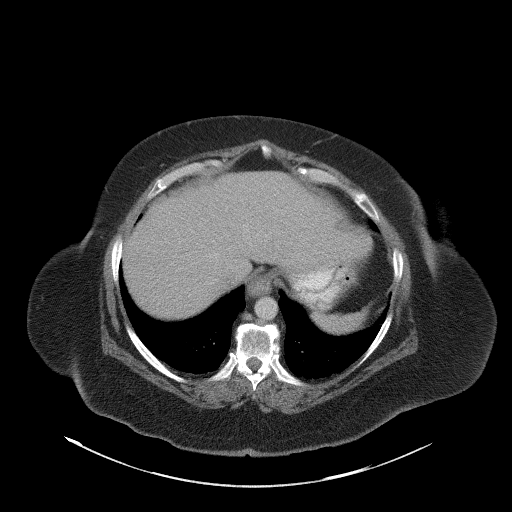
[im 90/96  soft-tissue]
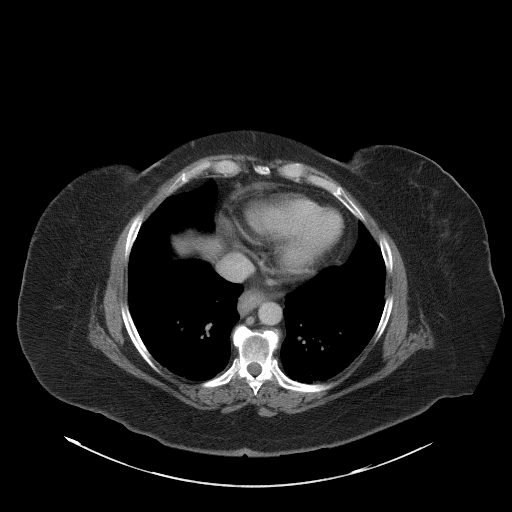

[Series 5: abd/pelvis 3.0 coronal · coronal · 0.92mm/px · 3 of 110 slices shown]
[im 37/110  soft-tissue]
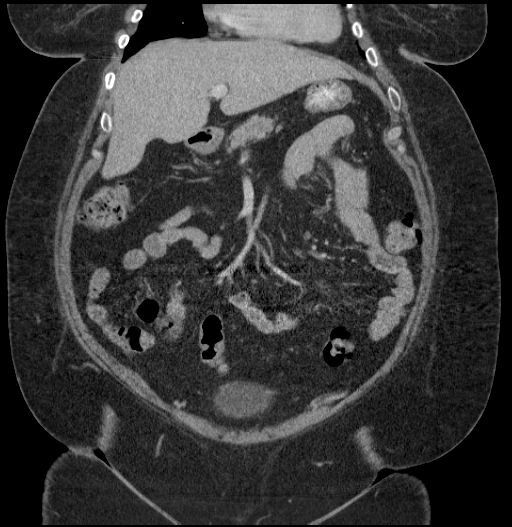
[im 49/110  soft-tissue]
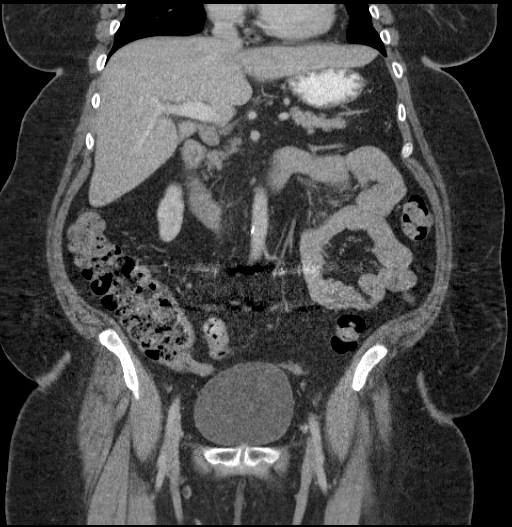
[im 61/110  soft-tissue]
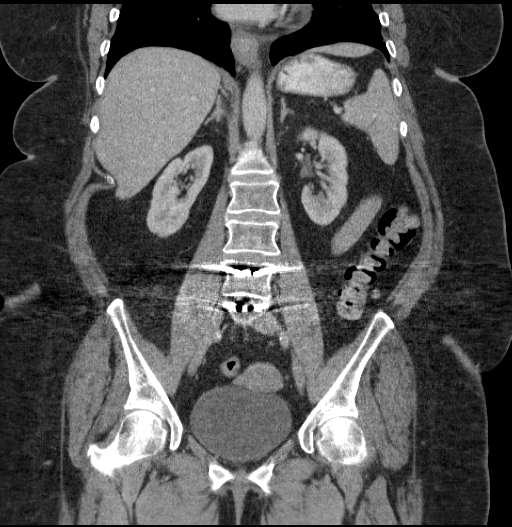

[18 of 46 positions shown; findings below may reference images not displayed]

FINDINGS: Post cholecystectomy.  Diffuse hepatic steatosis.

Spleen, pancreas, adrenal glands, kidneys are within normal limits.

No free fluid.

Bladder, uterus, and adnexa are within normal limits.

L4-S1 posterior fusion.
IMPRESSION: No acute intra-abdominal pathology.

## 2012-01-05 MED ORDER — HYDROMORPHONE HCL PF 1 MG/ML IJ SOLN
1.0000 mg | Freq: Once | INTRAMUSCULAR | Status: AC
  Administered 2012-01-05: 1 mg via INTRAVENOUS
  Filled 2012-01-05: qty 1

## 2012-01-05 MED ORDER — SODIUM CHLORIDE 0.9 % IV SOLN
Freq: Once | INTRAVENOUS | Status: AC
Start: 2012-01-05 — End: 2012-01-05
  Administered 2012-01-05: 20:00:00 via INTRAVENOUS

## 2012-01-05 MED ORDER — METOCLOPRAMIDE HCL 5 MG/ML IJ SOLN
10.0000 mg | Freq: Once | INTRAMUSCULAR | Status: AC
  Administered 2012-01-05: 10 mg via INTRAVENOUS
  Filled 2012-01-05: qty 2

## 2012-01-05 MED ORDER — PROMETHAZINE HCL 25 MG PO TABS: 25.0000 mg | ORAL_TABLET | Freq: Four times a day (QID) | ORAL | Status: AC | PRN

## 2012-01-05 MED ORDER — IOHEXOL 300 MG/ML  SOLN
100.0000 mL | Freq: Once | INTRAMUSCULAR | Status: AC | PRN
  Administered 2012-01-05: 100 mL via INTRAVENOUS

## 2012-01-05 MED ORDER — IOHEXOL 300 MG/ML  SOLN
20.0000 mL | Freq: Once | INTRAMUSCULAR | Status: AC | PRN
  Administered 2012-01-05: 20 mL via ORAL

## 2012-01-05 MED ORDER — ONDANSETRON HCL 4 MG/2ML IJ SOLN
4.0000 mg | Freq: Once | INTRAMUSCULAR | Status: AC
  Administered 2012-01-05: 4 mg via INTRAVENOUS
  Filled 2012-01-05: qty 2

## 2012-01-05 MED ORDER — HYDROCODONE-ACETAMINOPHEN 5-325 MG PO TABS: 2.0000 | ORAL_TABLET | ORAL | Status: AC | PRN

## 2012-01-05 MED ORDER — CIPROFLOXACIN HCL 500 MG PO TABS: 500.0000 mg | ORAL_TABLET | Freq: Two times a day (BID) | ORAL | Status: AC

## 2012-01-05 MED ORDER — DEXTROSE 5 % IV SOLN
1.0000 g | INTRAVENOUS | Status: DC
  Administered 2012-01-05: 1 g via INTRAVENOUS
  Filled 2012-01-05: qty 10

## 2012-01-05 NOTE — ED Provider Notes (Signed)
Medical screening examination/treatment/procedure(s) were performed by non-physician practitioner and as supervising physician I was immediately available for consultation/collaboration.   Rolan Bucco, MD 01/05/12 2308

## 2012-01-05 NOTE — ED Notes (Signed)
Notified nurse of bp

## 2012-01-05 NOTE — ED Provider Notes (Signed)
History     CSN: [id number]  Arrival date & time 01/05/12  1806   None     Chief Complaint  Patient presents with  . Abdominal Pain    (Consider location/radiation/quality/duration/timing/severity/associated sxs/prior treatment) Patient is a 56 y.o. female presenting with abdominal pain. The history is provided by the patient. No language interpreter was used.  Abdominal Pain The primary symptoms of the illness include abdominal pain and nausea. The current episode started less than 1 hour ago. The onset of the illness was gradual. The problem has been gradually worsening.  Nausea began today. Associated with: nothing.  Associated with: nothing. The patient states that she believes she is currently not pregnant. The patient has not had a change in bowel habit. Additional symptoms associated with the illness include back pain. Symptoms associated with the illness do not include heartburn, constipation or hematuria. Significant associated medical issues include diabetes and cardiac disease. Significant associated medical issues do not include GERD or diverticulitis.  Pt reports she began having severe pain in mid upper abdomen today.  Pt reports pain to both sides of her back.  Pt denies any change in diet.  No chest pain  Past Medical History  Diagnosis Date  . Angina pectoris     negative stress test May 2012 at [facility]  . Asthma   . Acid reflux   . Infections of kidney     As child, no problems since  . Diabetes mellitus   . Stroke   . TIA (transient ischemic attack)     2010  . Depressed   . Thyroid disease   . Arthritis     Past Surgical History  Procedure Date  . Back surgery   . Cesarean section     x 2  . Shoulder surgery   . Cholecystectomy   . Tonsillectomy     Family History  Problem Relation Age of Onset  . Diabetes Mother   . Heart failure Mother   . Cancer Father     History  Substance Use Topics  . Smoking status: Former Games developer  .  Smokeless tobacco: Not on file  . Alcohol Use: No    OB History    Grav Para Term Preterm Abortions TAB SAB Ect Mult Living   7 2              Review of Systems  Gastrointestinal: Positive for nausea and abdominal pain. Negative for heartburn and constipation.  Genitourinary: Negative for hematuria.  Musculoskeletal: Positive for back pain.  All other systems reviewed and are negative.    Allergies  Crestor; Darvocet; Ibuprofen; Penicillins; Ultram; and Pravachol  Home Medications   Current Outpatient Rx  Name Route Sig Dispense Refill  . ALBUTEROL SULFATE HFA 108 (90 BASE) MCG/ACT IN AERS Inhalation Inhale 2 puffs into the lungs every 4 (four) hours as needed. For shortness of breath     . ALBUTEROL SULFATE (2.5 MG/3ML) 0.083% IN NEBU Nebulization Take 2.5 mg by nebulization every 6 (six) hours as needed. For shortness of breath    . ASPIRIN 325 MG PO TBEC Oral Take 325 mg by mouth daily.      . ATORVASTATIN CALCIUM 40 MG PO TABS Oral Take 40 mg by mouth daily.      Marland Kitchen CALCIUM CARBONATE ANTACID 500 MG PO CHEW Oral Chew 2 tablets by mouth 2 (two) times daily.      Marland Kitchen VITAMIN D 2000 UNITS PO TABS Oral Take 2,000 Units  by mouth daily.      . DEXLANSOPRAZOLE 60 MG PO CPDR Oral Take 60 mg by mouth every morning.      Marland Kitchen ESCITALOPRAM OXALATE 10 MG PO TABS Oral Take 10 mg by mouth daily.      Marland Kitchen FLUTICASONE PROPIONATE 50 MCG/ACT NA SUSP Each Nare Place 2 sprays into both nostrils daily.      Marland Kitchen FLUTICASONE PROPIONATE  HFA 110 MCG/ACT IN AERO Inhalation Inhale 2 puffs into the lungs 2 (two) times daily.      Marland Kitchen GABAPENTIN 300 MG PO CAPS Oral Take 300 mg by mouth 2 (two) times daily.     Marland Kitchen GABAPENTIN 300 MG PO CAPS Oral Take 1,200 mg by mouth at bedtime.      Marland Kitchen LEVOTHYROXINE SODIUM 125 MCG PO TABS Oral Take 125 mcg by mouth daily.      Marland Kitchen METFORMIN HCL 1000 MG PO TABS Oral Take 1,000 mg by mouth 2 (two) times daily.      Marland Kitchen METHOCARBAMOL 750 MG PO TABS Oral Take 750 mg by mouth 2 (two) times  daily as needed. For muscle spasms    . RALOXIFENE HCL 60 MG PO TABS Oral Take 60 mg by mouth daily.     Marland Kitchen RAMIPRIL 5 MG PO CAPS Oral Take 5 mg by mouth daily.      Marland Kitchen SITAGLIPTIN PHOSPHATE 100 MG PO TABS Oral Take 100 mg by mouth daily.        BP 137/52  Pulse 80  Temp 97.8 F (36.6 C) (Oral)  Resp 18  Ht 5\' 1"  (1.549 m)  Wt 255 lb (115.667 kg)  BMI 48.18 kg/m2  SpO2 100%  Physical Exam  Nursing note and vitals reviewed. Constitutional: She is oriented to person, place, and time. She appears well-developed and well-nourished.  HENT:  Head: Normocephalic and atraumatic.  Right Ear: External ear normal.  Left Ear: External ear normal.  Nose: Nose normal.  Mouth/Throat: Oropharynx is clear and moist.  Eyes: Conjunctivae and EOM are normal. Pupils are equal, round, and reactive to light.  Neck: Normal range of motion.  Cardiovascular: Normal rate and regular rhythm.   Pulmonary/Chest: Effort normal and breath sounds normal.  Abdominal: Soft.  Musculoskeletal: Normal range of motion.  Neurological: She is alert and oriented to person, place, and time. She has normal reflexes.  Skin: Skin is warm.  Psychiatric: She has a normal mood and affect.    ED Course  Procedures (including critical care time)   Labs Reviewed  URINALYSIS, ROUTINE W REFLEX MICROSCOPIC   No results found.   No diagnosis found.   Date: 01/05/2012  Rate: 68  Rhythm: normal sinus rhythm  QRS Axis: normal  Intervals: normal  ST/T Wave abnormalities: normal  Conduction Disutrbances:none  Narrative Interpretation:   Old EKG Reviewed: unchanged   MDM  Pt given Iv dilaudid and zofran,   Lipase is elevated and pt has uti  Pt advised of uti and elevated lipase.   I advised pt to follow up with her MD for recheck tomorrow.  I will given Rocephin here for uti.   Pt given rx for cipro, phenergan and hydrocodone.   Pt advised clear liquids x 24 hours.  Return if symptoms worsen or  change      Lonia Skinner [place], [place] 01/05/12 2218  Lonia Skinner [place], [place] 01/05/12 2218

## 2012-01-05 NOTE — ED Notes (Signed)
Pt c/o " severe" mid abd pain x 2 hrs , denies n/v/d

## 2014-04-29 ENCOUNTER — Encounter (HOSPITAL_BASED_OUTPATIENT_CLINIC_OR_DEPARTMENT_OTHER): Payer: Self-pay | Admitting: *Deleted

## 2021-04-10 ENCOUNTER — Observation Stay (HOSPITAL_COMMUNITY): Payer: Medicare HMO

## 2021-04-10 ENCOUNTER — Encounter (HOSPITAL_COMMUNITY): Payer: Self-pay | Admitting: *Deleted

## 2021-04-10 ENCOUNTER — Other Ambulatory Visit: Payer: Self-pay

## 2021-04-10 ENCOUNTER — Observation Stay (HOSPITAL_COMMUNITY)
Admission: EM | Admit: 2021-04-10 | Discharge: 2021-04-11 | Disposition: A | Discharge status: Home / Self Care | Payer: Medicare HMO | Attending: Internal Medicine | Admitting: Internal Medicine

## 2021-04-10 ENCOUNTER — Emergency Department (HOSPITAL_COMMUNITY): Payer: Medicare HMO

## 2021-04-10 DIAGNOSIS — J45909 Unspecified asthma, uncomplicated: Secondary | ICD-10-CM | POA: Diagnosis present

## 2021-04-10 DIAGNOSIS — E1122 Type 2 diabetes mellitus with diabetic chronic kidney disease: Secondary | ICD-10-CM | POA: Diagnosis not present

## 2021-04-10 DIAGNOSIS — I129 Hypertensive chronic kidney disease with stage 1 through stage 4 chronic kidney disease, or unspecified chronic kidney disease: Secondary | ICD-10-CM | POA: Insufficient documentation

## 2021-04-10 DIAGNOSIS — Z87891 Personal history of nicotine dependence: Secondary | ICD-10-CM | POA: Insufficient documentation

## 2021-04-10 DIAGNOSIS — R531 Weakness: Secondary | ICD-10-CM | POA: Diagnosis present

## 2021-04-10 DIAGNOSIS — E1159 Type 2 diabetes mellitus with other circulatory complications: Secondary | ICD-10-CM | POA: Diagnosis present

## 2021-04-10 DIAGNOSIS — E039 Hypothyroidism, unspecified: Secondary | ICD-10-CM | POA: Insufficient documentation

## 2021-04-10 DIAGNOSIS — Z7982 Long term (current) use of aspirin: Secondary | ICD-10-CM | POA: Diagnosis not present

## 2021-04-10 DIAGNOSIS — E1169 Type 2 diabetes mellitus with other specified complication: Secondary | ICD-10-CM | POA: Diagnosis present

## 2021-04-10 DIAGNOSIS — I639 Cerebral infarction, unspecified: Secondary | ICD-10-CM | POA: Diagnosis not present

## 2021-04-10 DIAGNOSIS — N1831 Chronic kidney disease, stage 3a: Secondary | ICD-10-CM | POA: Diagnosis not present

## 2021-04-10 DIAGNOSIS — Z7984 Long term (current) use of oral hypoglycemic drugs: Secondary | ICD-10-CM | POA: Diagnosis not present

## 2021-04-10 DIAGNOSIS — E1142 Type 2 diabetes mellitus with diabetic polyneuropathy: Secondary | ICD-10-CM | POA: Insufficient documentation

## 2021-04-10 DIAGNOSIS — Z20822 Contact with and (suspected) exposure to covid-19: Secondary | ICD-10-CM | POA: Diagnosis not present

## 2021-04-10 DIAGNOSIS — N183 Chronic kidney disease, stage 3 unspecified: Secondary | ICD-10-CM | POA: Diagnosis present

## 2021-04-10 DIAGNOSIS — R200 Anesthesia of skin: Secondary | ICD-10-CM

## 2021-04-10 DIAGNOSIS — E119 Type 2 diabetes mellitus without complications: Secondary | ICD-10-CM

## 2021-04-10 DIAGNOSIS — E785 Hyperlipidemia, unspecified: Secondary | ICD-10-CM | POA: Diagnosis present

## 2021-04-10 DIAGNOSIS — Z8673 Personal history of transient ischemic attack (TIA), and cerebral infarction without residual deficits: Secondary | ICD-10-CM | POA: Insufficient documentation

## 2021-04-10 DIAGNOSIS — I152 Hypertension secondary to endocrine disorders: Secondary | ICD-10-CM | POA: Diagnosis present

## 2021-04-10 LAB — COMPREHENSIVE METABOLIC PANEL
ALT: 20 U/L (ref 0–44)
AST: 35 U/L (ref 15–41)
Albumin: 3.4 g/dL — ABNORMAL LOW (ref 3.5–5.0)
Alkaline Phosphatase: 57 U/L (ref 38–126)
Anion gap: 13 (ref 5–15)
BUN: 14 mg/dL (ref 8–23)
CO2: 21 mmol/L — ABNORMAL LOW (ref 22–32)
Calcium: 9.3 mg/dL (ref 8.9–10.3)
Chloride: 100 mmol/L (ref 98–111)
Creatinine, Ser: 1.38 mg/dL — ABNORMAL HIGH (ref 0.44–1.00)
GFR, Estimated: 42 mL/min — ABNORMAL LOW (ref >60)
Glucose, Bld: 123 mg/dL — ABNORMAL HIGH (ref 70–99)
Potassium: 4.7 mmol/L (ref 3.5–5.1)
Sodium: 134 mmol/L — ABNORMAL LOW (ref 135–145)
Total Bilirubin: 0.4 mg/dL (ref 0.3–1.2)
Total Protein: 7.5 g/dL (ref 6.5–8.1)

## 2021-04-10 LAB — I-STAT CHEM 8, ED
BUN: 16 mg/dL (ref 8–23)
Calcium, Ion: 1 mmol/L — ABNORMAL LOW (ref 1.15–1.40)
Chloride: 101 mmol/L (ref 98–111)
Creatinine, Ser: 1.3 mg/dL — ABNORMAL HIGH (ref 0.44–1.00)
Glucose, Bld: 112 mg/dL — ABNORMAL HIGH (ref 70–99)
HCT: 41 % (ref 36.0–46.0)
Hemoglobin: 13.9 g/dL (ref 12.0–15.0)
Potassium: 4.3 mmol/L (ref 3.5–5.1)
Sodium: 136 mmol/L (ref 135–145)
TCO2: 23 mmol/L (ref 22–32)

## 2021-04-10 LAB — DIFFERENTIAL
Abs Immature Granulocytes: 0.03 10*3/uL (ref 0.00–0.07)
Basophils Absolute: 0 10*3/uL (ref 0.0–0.1)
Basophils Relative: 0 %
Eosinophils Absolute: 0.4 10*3/uL (ref 0.0–0.5)
Eosinophils Relative: 5 %
Immature Granulocytes: 0 %
Lymphocytes Relative: 35 %
Lymphs Abs: 2.9 10*3/uL (ref 0.7–4.0)
Monocytes Absolute: 0.6 10*3/uL (ref 0.1–1.0)
Monocytes Relative: 7 %
Neutro Abs: 4.3 10*3/uL (ref 1.7–7.7)
Neutrophils Relative %: 53 %

## 2021-04-10 LAB — CBC
HCT: 40.2 % (ref 36.0–46.0)
Hemoglobin: 12.7 g/dL (ref 12.0–15.0)
MCH: 30.6 pg (ref 26.0–34.0)
MCHC: 31.6 g/dL (ref 30.0–36.0)
MCV: 96.9 fL (ref 80.0–100.0)
Platelets: 267 10*3/uL (ref 150–400)
RBC: 4.15 MIL/uL (ref 3.87–5.11)
RDW: 15.3 % (ref 11.5–15.5)
WBC: 8.2 10*3/uL (ref 4.0–10.5)
nRBC: 0 % (ref 0.0–0.2)

## 2021-04-10 LAB — APTT: aPTT: 24 seconds (ref 24–36)

## 2021-04-10 LAB — PROTIME-INR
INR: 1 (ref 0.8–1.2)
Prothrombin Time: 13.6 seconds (ref 11.4–15.2)

## 2021-04-10 LAB — RESP PANEL BY RT-PCR (FLU A&B, COVID) ARPGX2
Influenza A by PCR: NEGATIVE
Influenza B by PCR: NEGATIVE
SARS Coronavirus 2 by RT PCR: NEGATIVE

## 2021-04-10 IMAGING — MR MR MRA HEAD W/O CM
2 series · 20 of 48 positions shown · non-contrast
Comparison: No pertinent prior exam.

CLINICAL DATA: Acute neurologic deficit

EXAM:
MRA HEAD WITHOUT CONTRAST
TECHNIQUE: Angiographic images of the Circle of Willis were acquired using MRA
technique without intravenous contrast.

[Series 2: ax (id) · axial · 1.0mm · 0.43mm/px · z∈[-58,+29]mm · 19 of 183 slices shown]
[im 1/183]
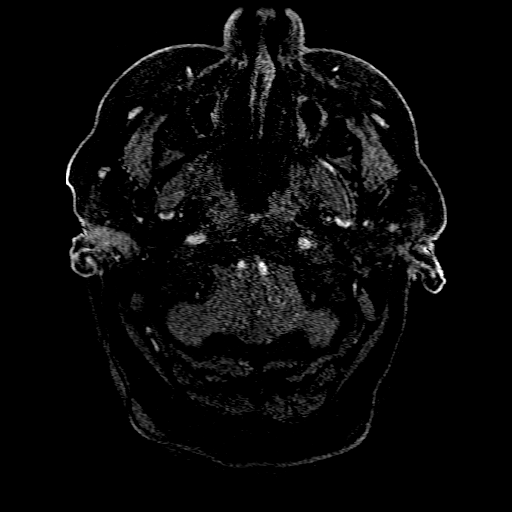
[im 4/183]
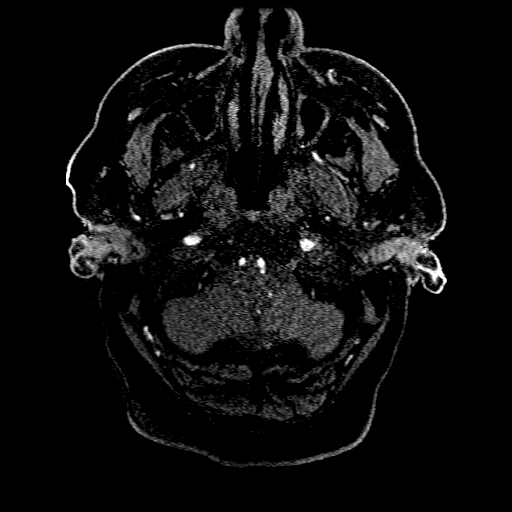
[im 8/183]
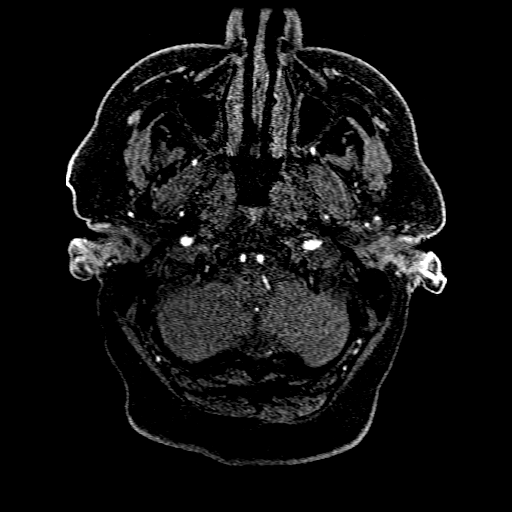
[im 12/183]
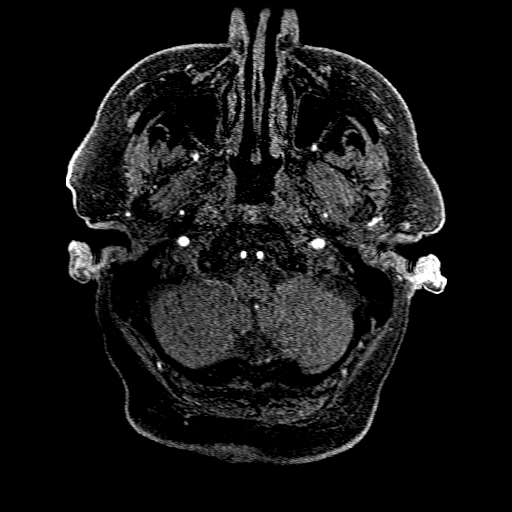
[im 16/183]
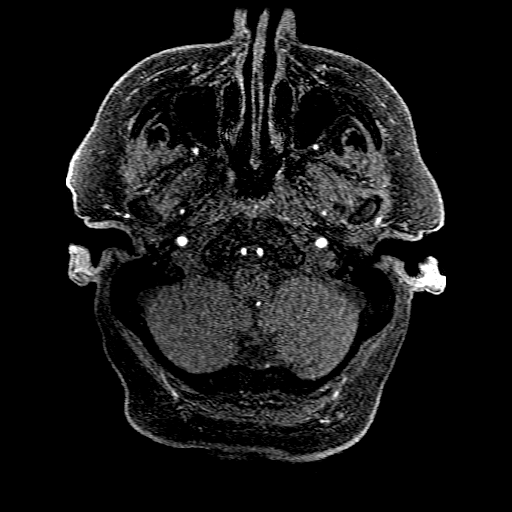
[im 20/183]
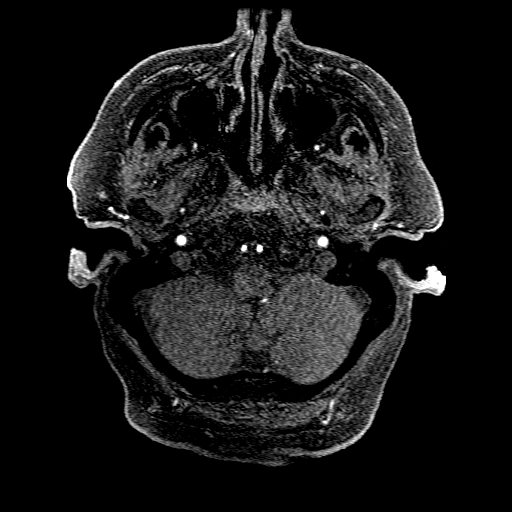
[im 24/183]
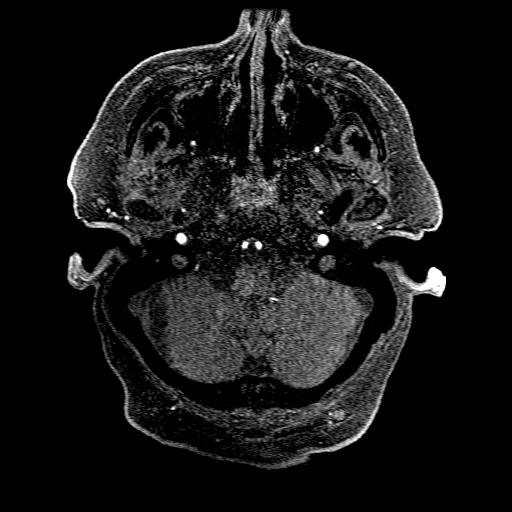
[im 28/183]
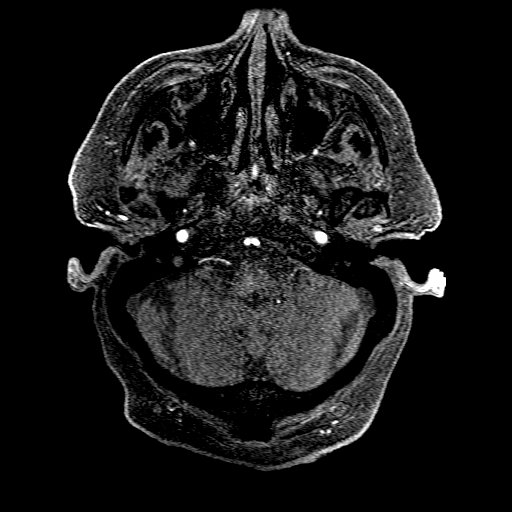
[im 32/183]
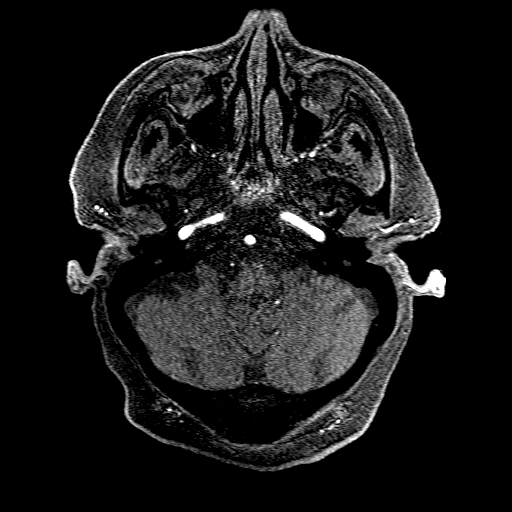
[im 36/183]
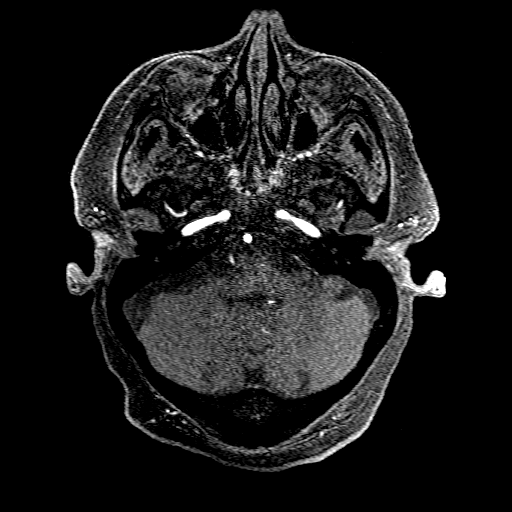
[im 40/183]
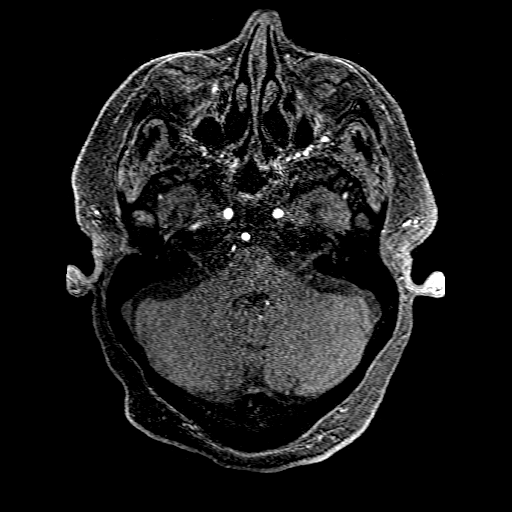
[im 56/183]
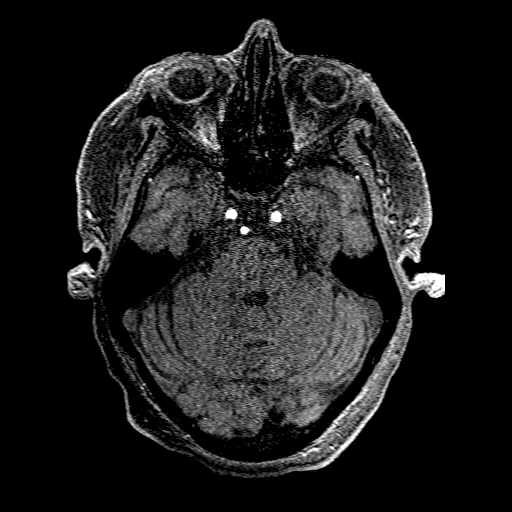
[im 80/183]
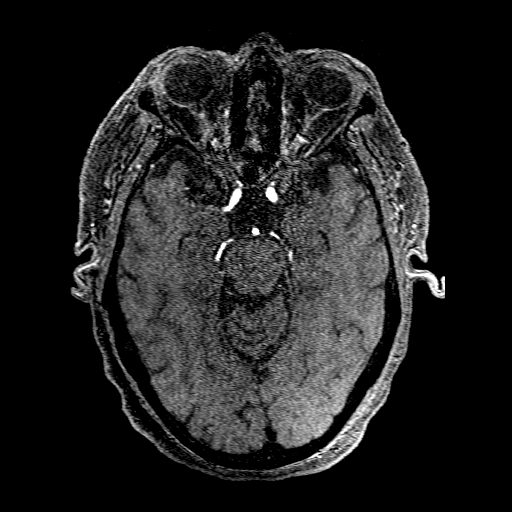
[im 92/183]
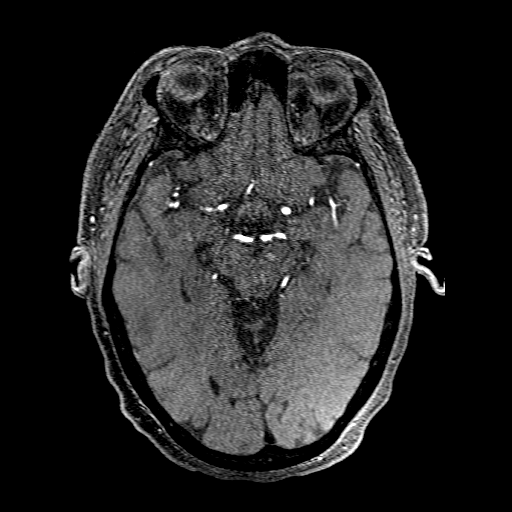
[im 103/183]
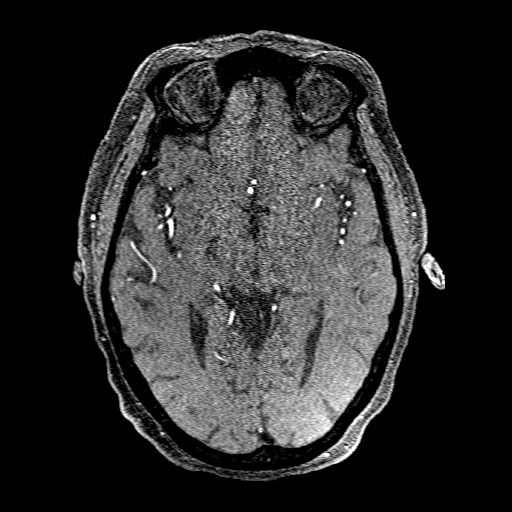
[im 127/183]
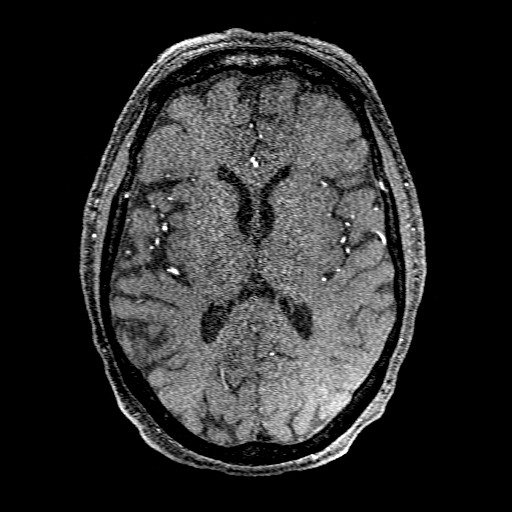
[im 151/183]
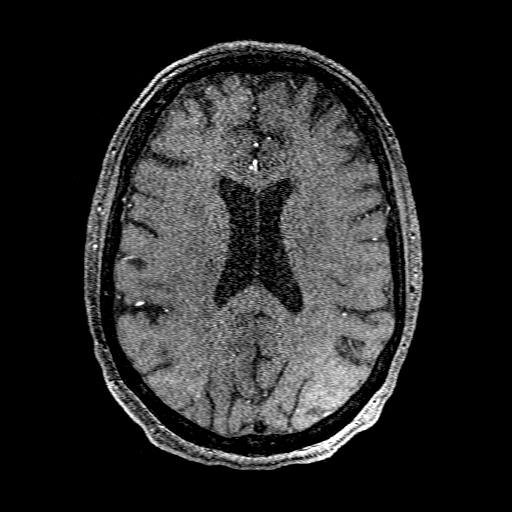
[im 155/183]
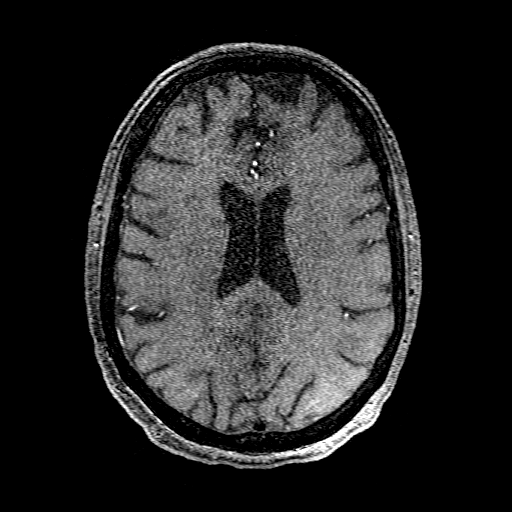
[im 175/183]
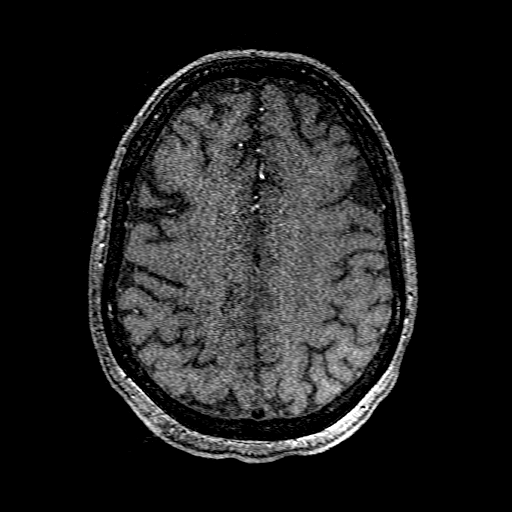

[Series 201: pjn:ax (id) · sagittal · 1.0mm · 0.43mm/px · 1 of 3 slices shown]
[im 1/3]
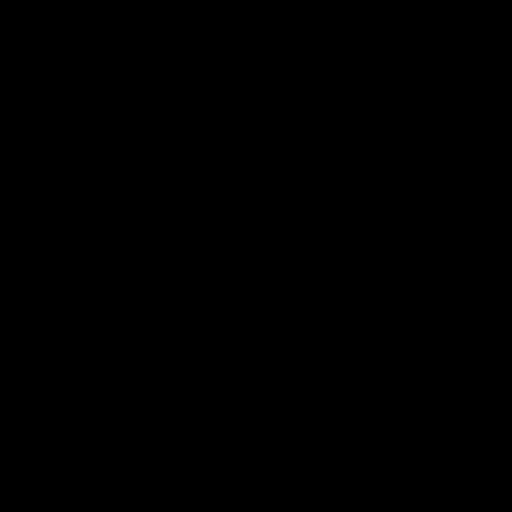

[20 of 48 positions shown; findings below may reference images not displayed]

FINDINGS: POSTERIOR CIRCULATION:

--Vertebral arteries: Normal

--Inferior cerebellar arteries: Normal.

--Basilar artery: Normal.

--Superior cerebellar arteries: Normal.

--Posterior cerebral arteries: Normal.

ANTERIOR CIRCULATION:

--Intracranial internal carotid arteries: Normal.

--Anterior cerebral arteries (ACA): Normal.

--Middle cerebral arteries (MCA): Normal.

ANATOMIC VARIANTS: None
IMPRESSION: Normal intracranial MRA.

## 2021-04-10 IMAGING — DX DG CHEST 1V PORT
1 series · 1 of 1 positions shown · non-contrast
Comparison: [DATE]

CLINICAL DATA: Code stroke, bronchitis

EXAM:
PORTABLE CHEST 1 VIEW

[chest ap]
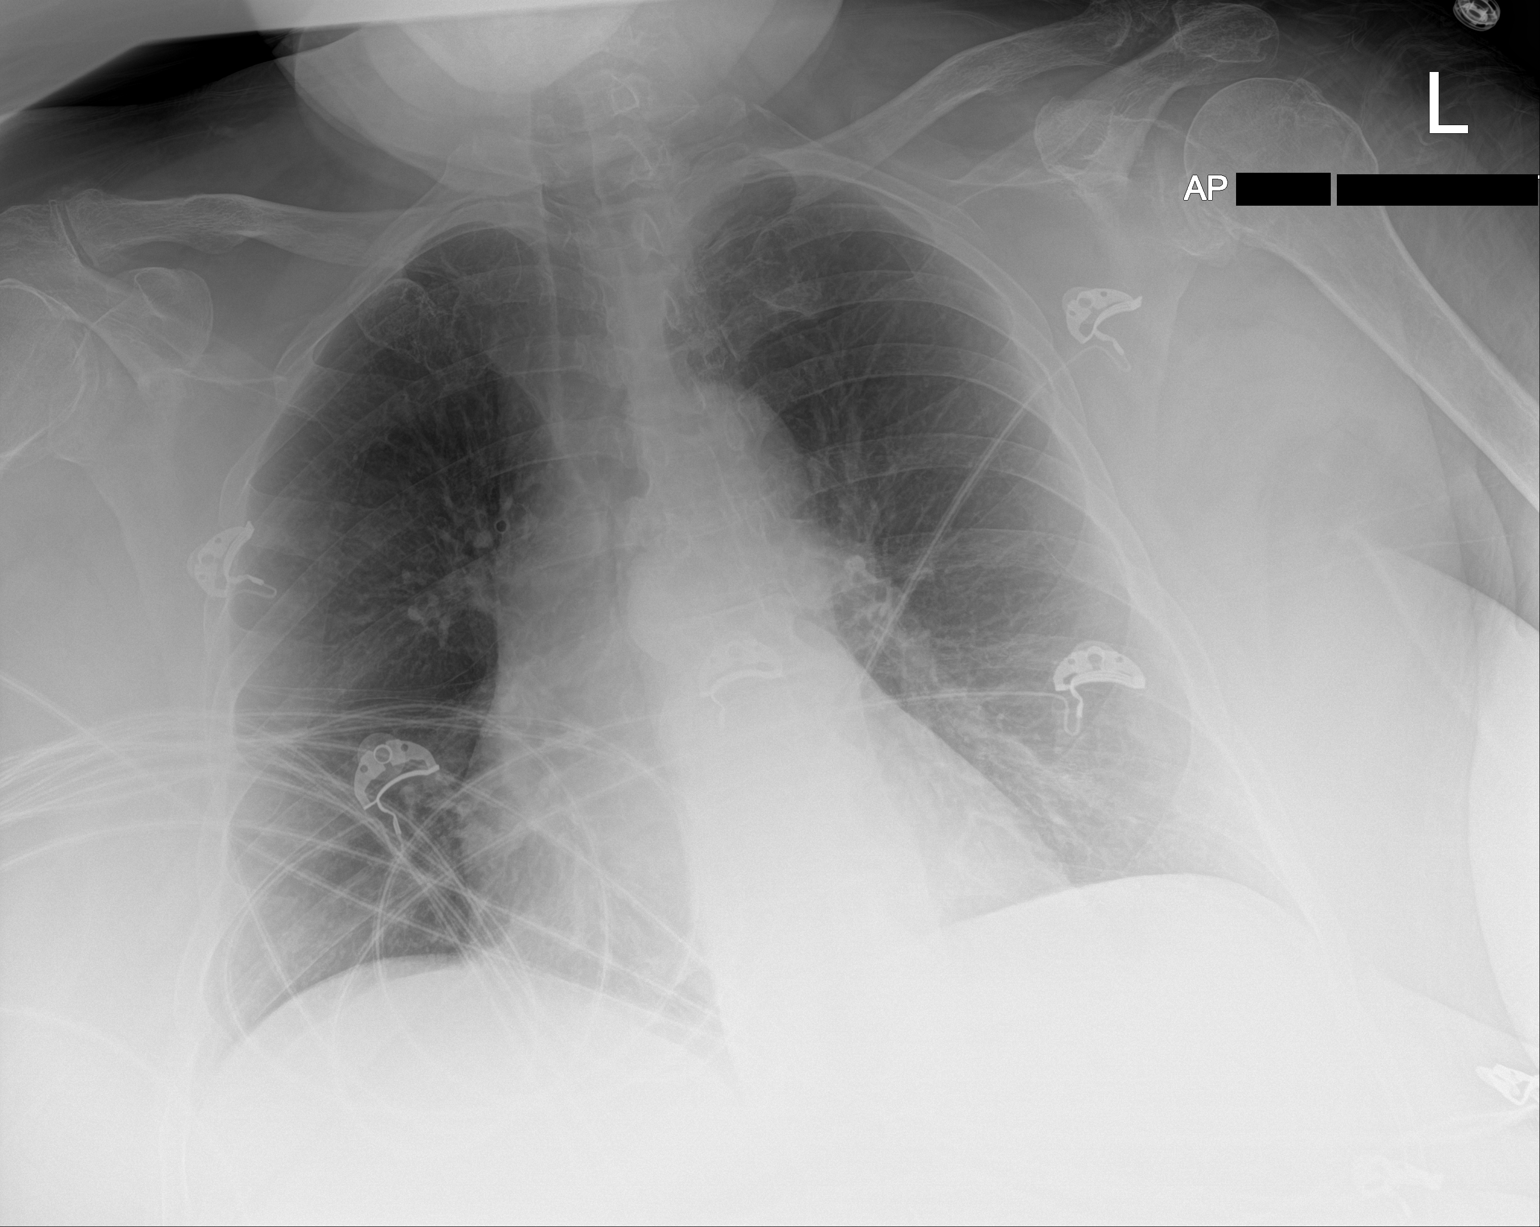

[1 of 1 positions shown; findings below may reference images not displayed]

FINDINGS: The heart size and mediastinal contours are unchanged given
projection and rotation. No focal pulmonary opacity. No pleural
effusion. No acute cardiopulmonary process.
IMPRESSION: No active disease.

## 2021-04-10 IMAGING — CT CT HEAD CODE STROKE
3 of 4 series · 14 of 47 positions shown, 16 images · non-contrast
Comparison: CT head [DATE]

CLINICAL DATA: Code stroke. Slurred speech, weakness, left-sided
drift numbness

EXAM:
CT HEAD WITHOUT CONTRAST
TECHNIQUE: Contiguous axial images were obtained from the base of the skull
through the vertex without intravenous contrast.

[Series 4: head 2.0 h70h · axial · 0.45mm/px · z∈[-89,+39]mm · 8 of 80 slices shown, 10 images]
[im 8/80  brain]
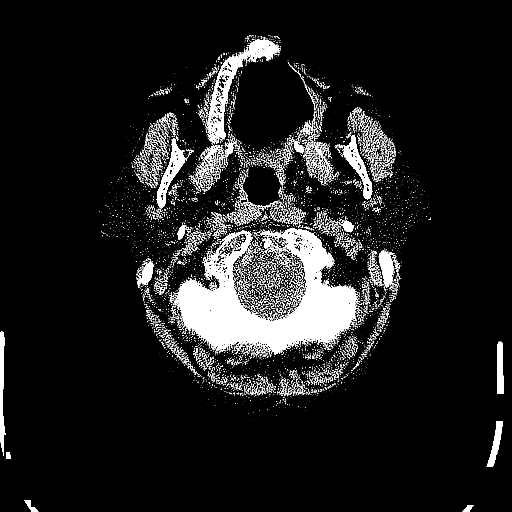
[im 8/80  bone]
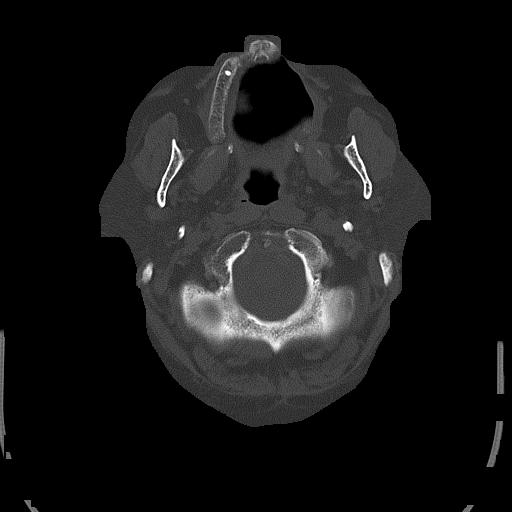
[im 16/80  brain]
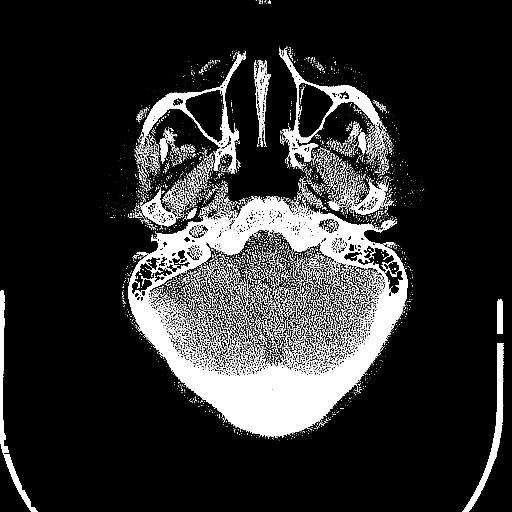
[im 24/80  brain]
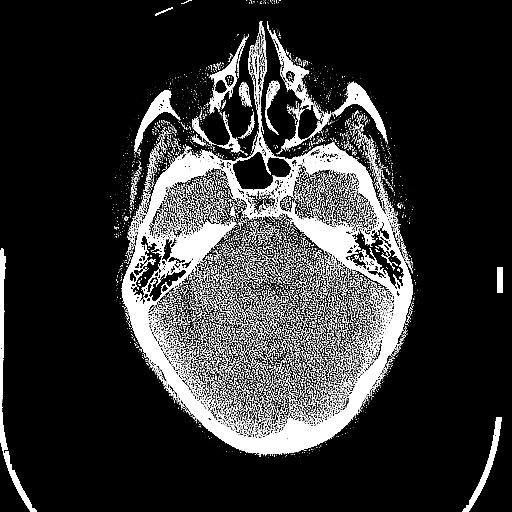
[im 36/80  brain]
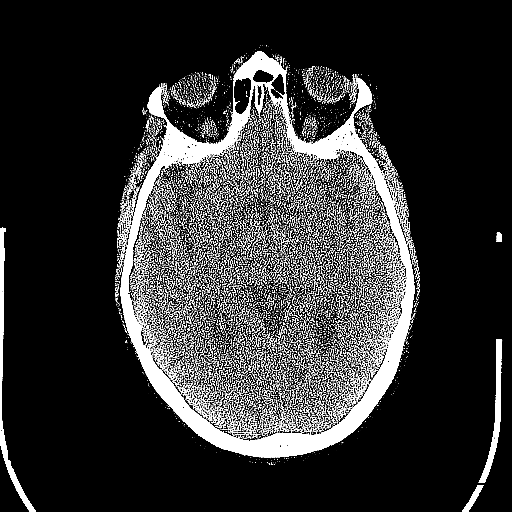
[im 44/80  brain]
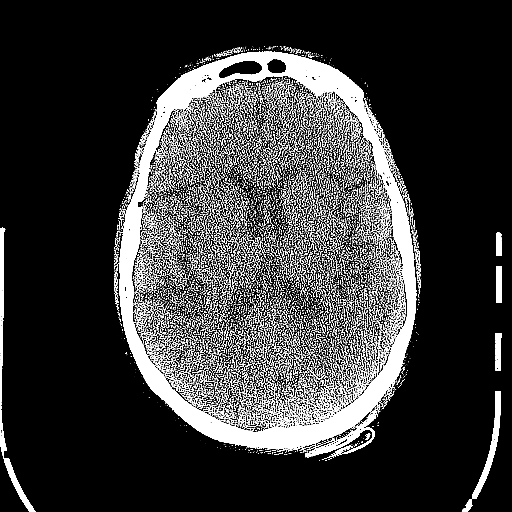
[im 44/80  bone]
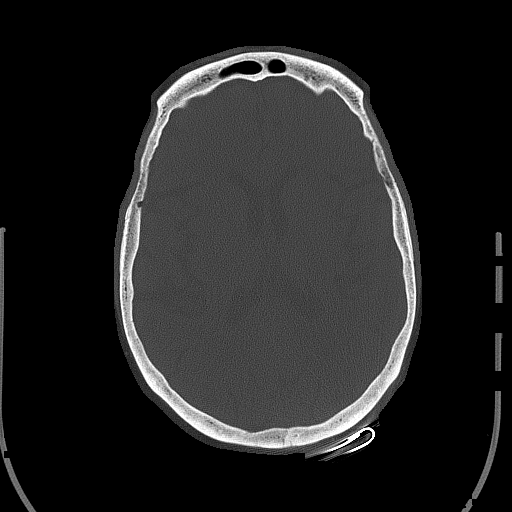
[im 56/80  brain]
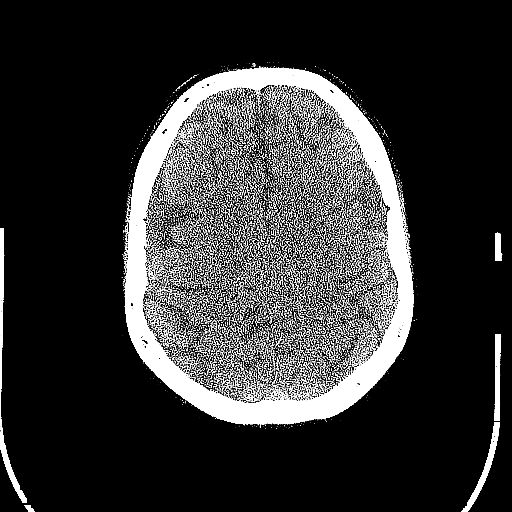
[im 64/80  brain]
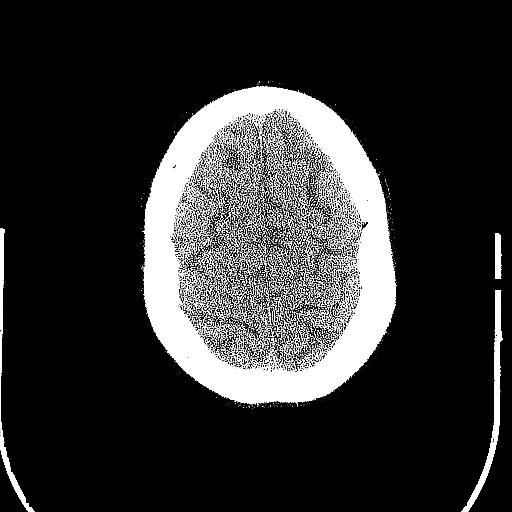
[im 72/80  brain]
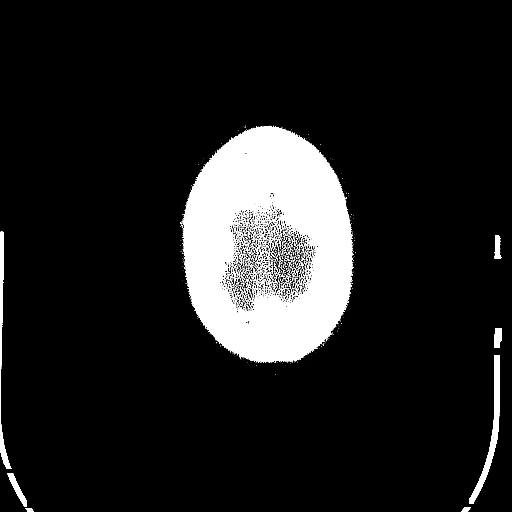

[Series 5: head 3.0 mpr cor · coronal · 0.29mm/px · 3 of 71 slices shown]
[im 26/71  brain]
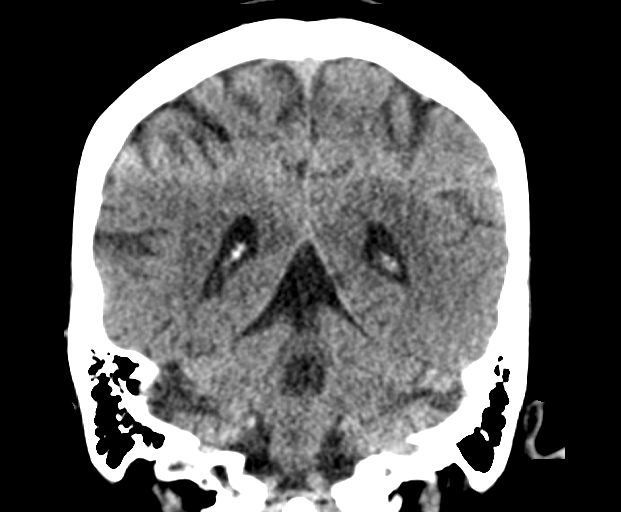
[im 32/71  brain]
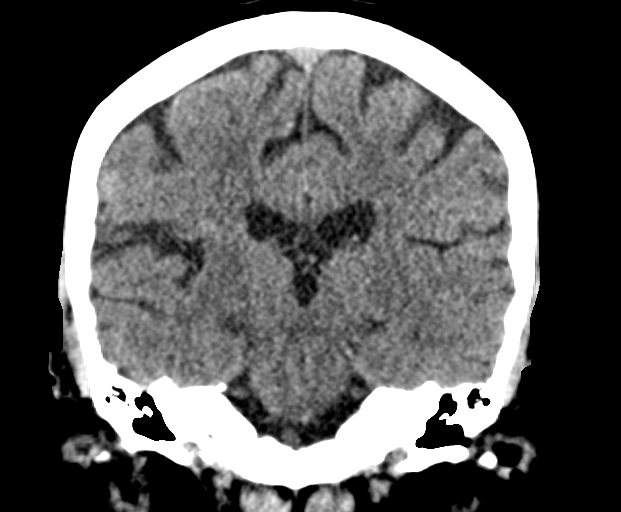
[im 39/71  brain]
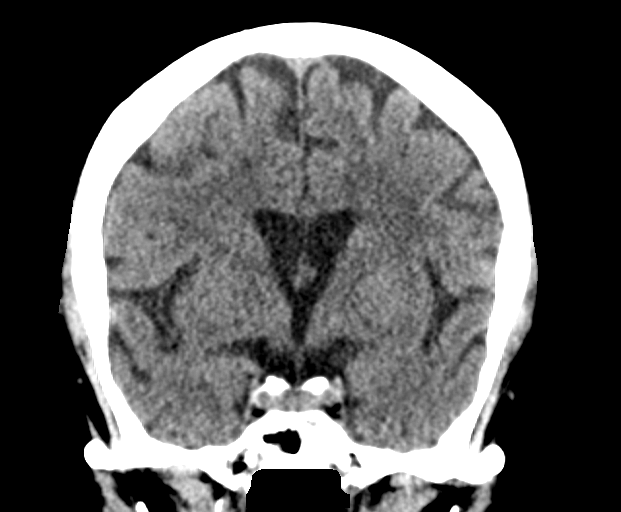

[Series 6: head 3.0 mpr sag · sagittal · 0.29mm/px · 3 of 61 slices shown]
[im 21/61  brain]
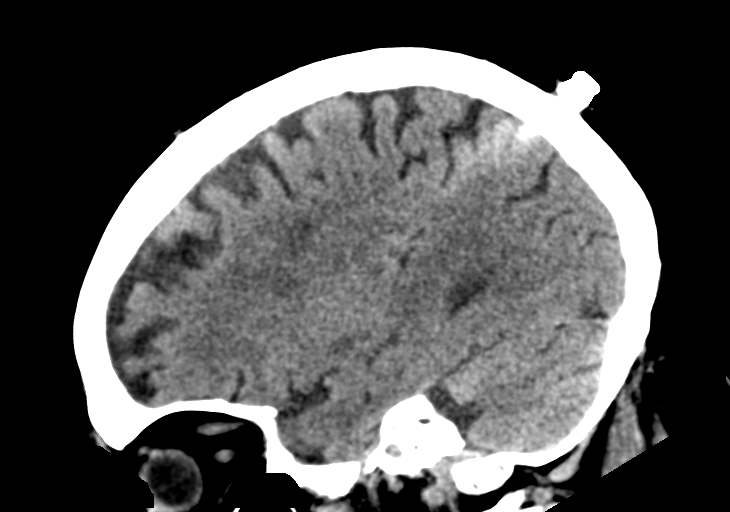
[im 31/61  brain]
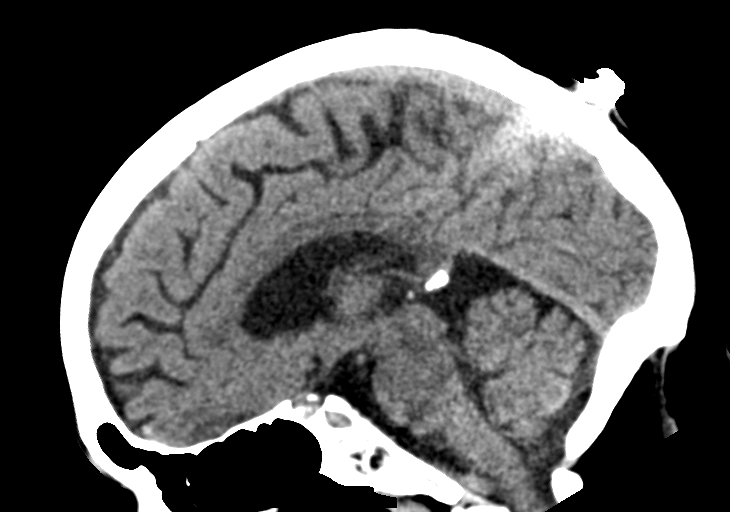
[im 41/61  brain]
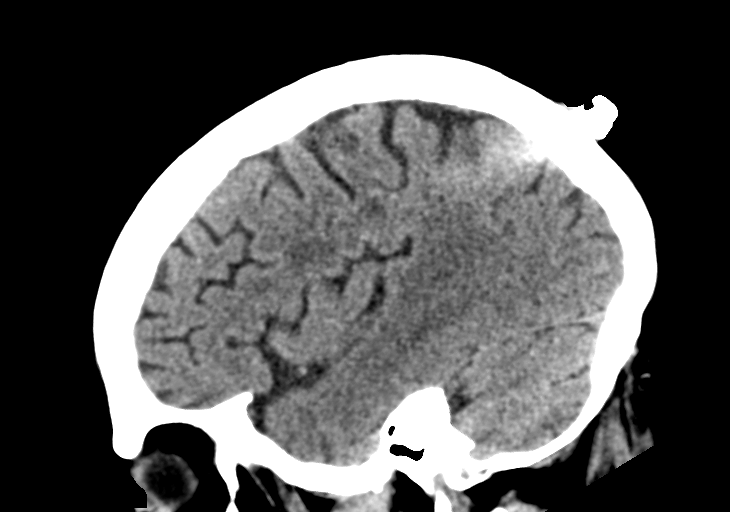

[14 of 47 positions shown; findings below may reference images not displayed]

FINDINGS: Brain: There is no acute intracranial hemorrhage, extra-axial fluid
collection, or acute infarct.

There is mild parenchymal volume loss. Foci of hypodensity in the
subcortical and periventricular white matter likely reflects sequela
of mild chronic white matter microangiopathy.

The ventricles are not enlarged. There is no mass lesion. There is
no midline shift.

Vascular: No hyperdense vessel or unexpected calcification.

Skull: Normal. Negative for fracture or focal lesion.

Sinuses/Orbits: The paranasal sinuses are clear.

Other: Bilateral lens implants are in place. The globes and orbits
are otherwise unremarkable.

ASPECTS (Alberta Stroke Program Early CT Score)

- Ganglionic level infarction (caudate, lentiform nuclei, internal
capsule, insula, M1-M3 cortex): 7

- Supraganglionic infarction (M4-M6 cortex): 3

Total score (0-10 with 10 being normal): 10
IMPRESSION: 1. No acute intracranial pathology.
2. ASPECTS is 10

These results were paged via AMION at the time of interpretation on
[DATE] at [DATE] to provider Dr DE LA O.

## 2021-04-10 IMAGING — MR MR HEAD W/O CM
6 of 11 series · 24 of 48 positions shown · non-contrast
Comparison: None.

CLINICAL DATA: Neuro deficit, acute, stroke suspected

EXAM:
MRI HEAD WITHOUT CONTRAST
TECHNIQUE: Multiplanar, multiecho pulse sequences of the brain and surrounding
structures were obtained without intravenous contrast.

[Series 3: DWI · axial · 3.0mm · 0.94mm/px · z∈[-73,+76]mm · 7 of 101 slices shown (1 of 2)]
[im 1/101]
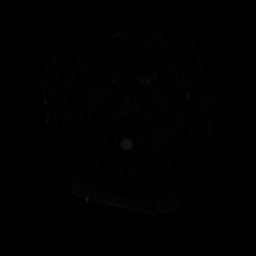
[im 17/101]
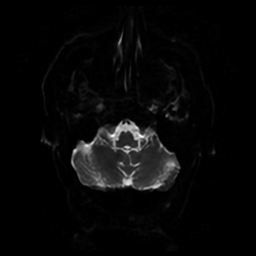
[im 34/101]
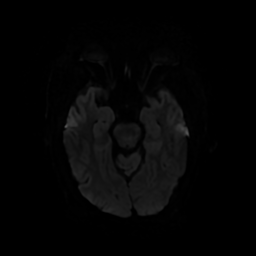
[im 51/101]
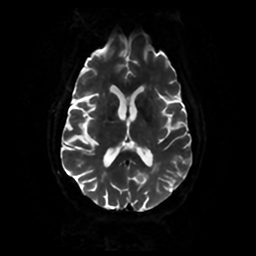
[im 67/101]
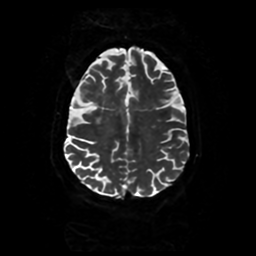
[im 84/101]
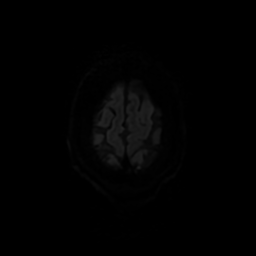
[im 101/101]
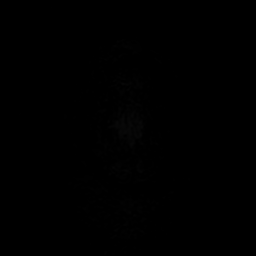

[Series 4: DWI · coronal · 4.0mm · 0.94mm/px · 5 of 74 slices shown (2 of 2)]
[im 1/74]
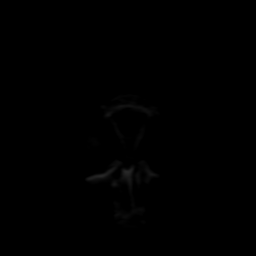
[im 19/74]
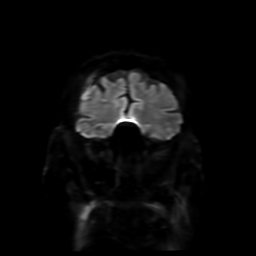
[im 37/74]
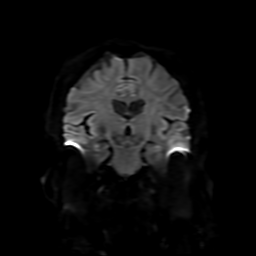
[im 55/74]
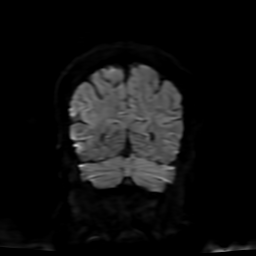
[im 74/74]
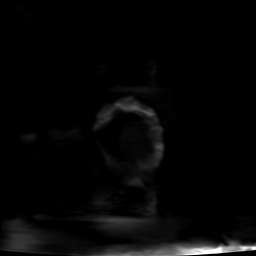

[Series 5: FLAIR · sagittal · 5.0mm · 0.23mm/px · 2 of 25 slices shown (1 of 2)]
[im 1/25]
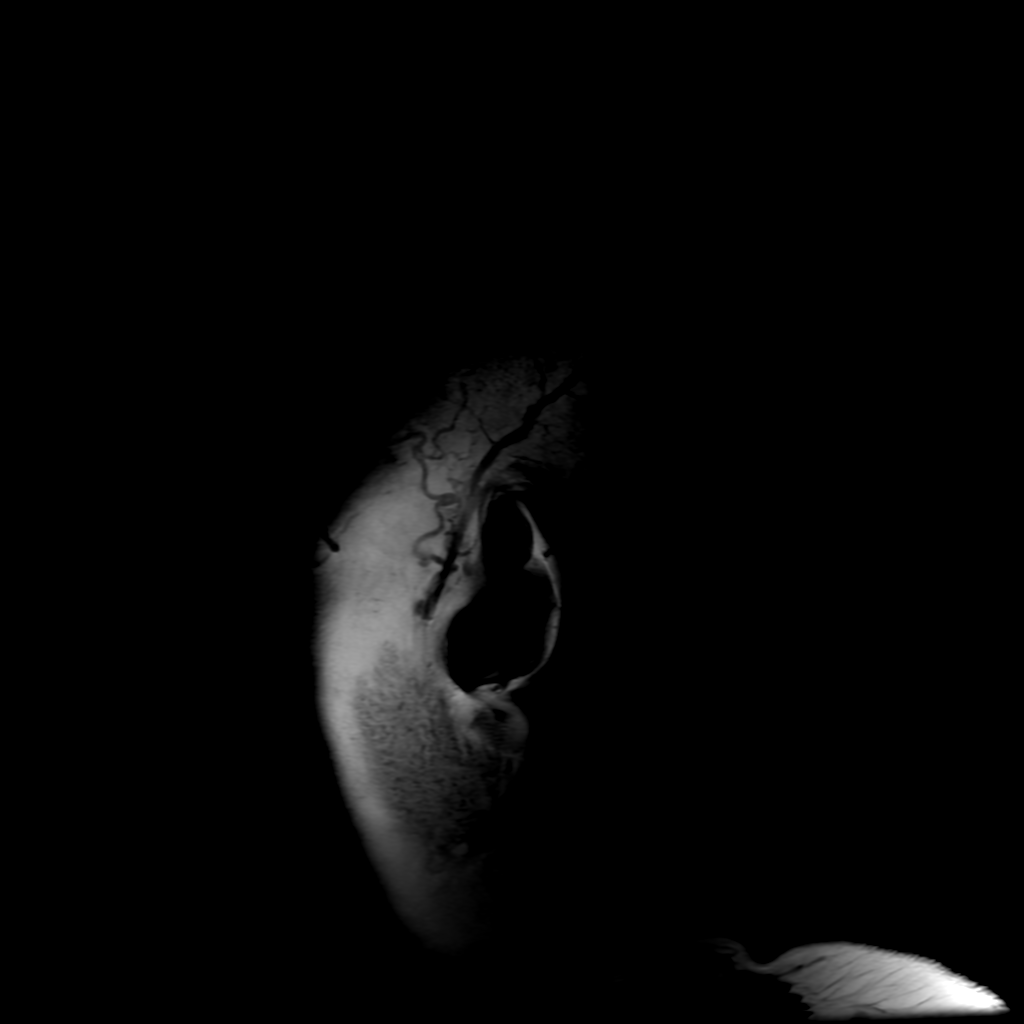
[im 25/25]
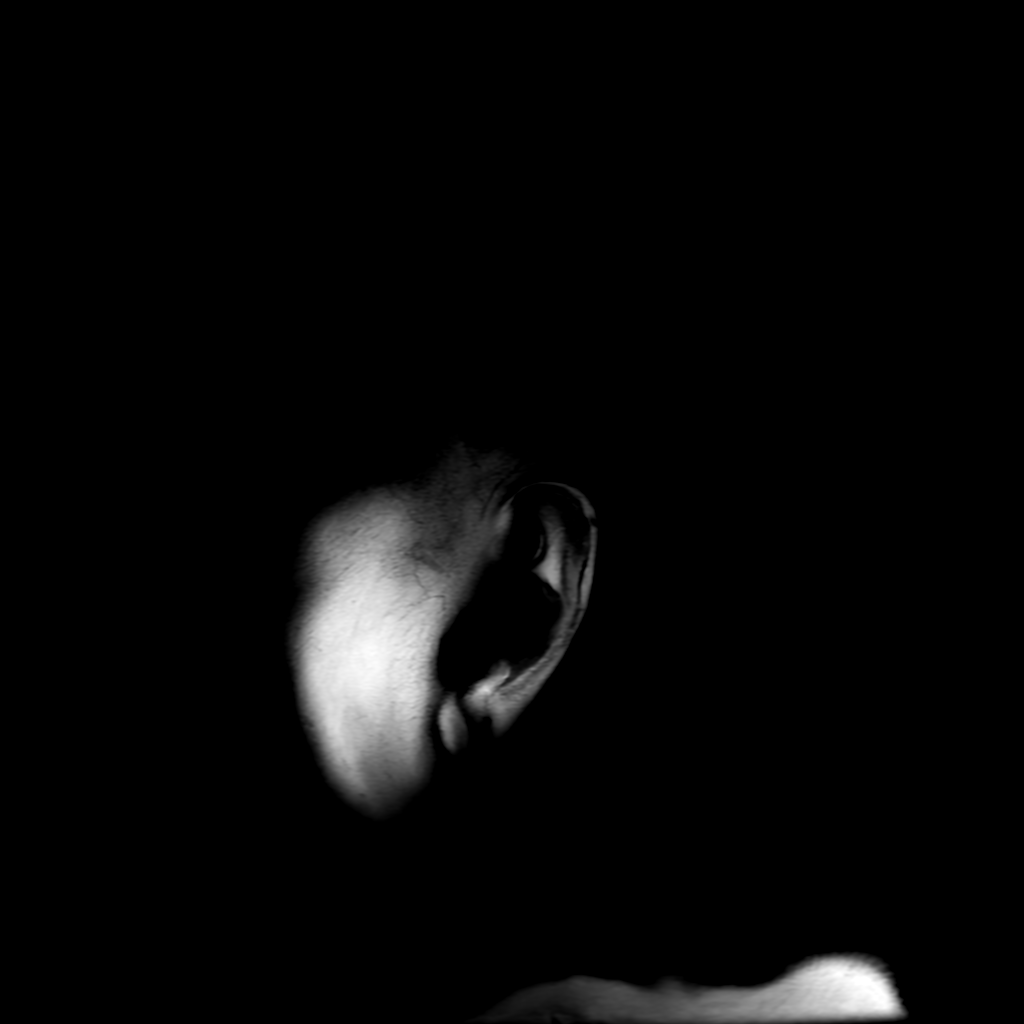

[Series 7: FLAIR · axial · 4.0mm · 0.45mm/px · z∈[-66,+83]mm · 3 of 35 slices shown (2 of 2)]
[im 1/35]
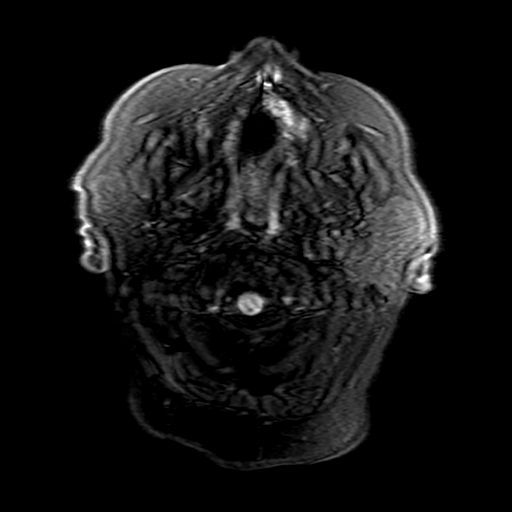
[im 18/35]
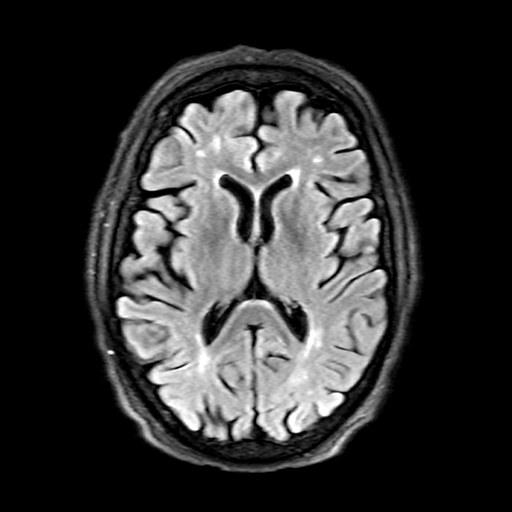
[im 35/35]
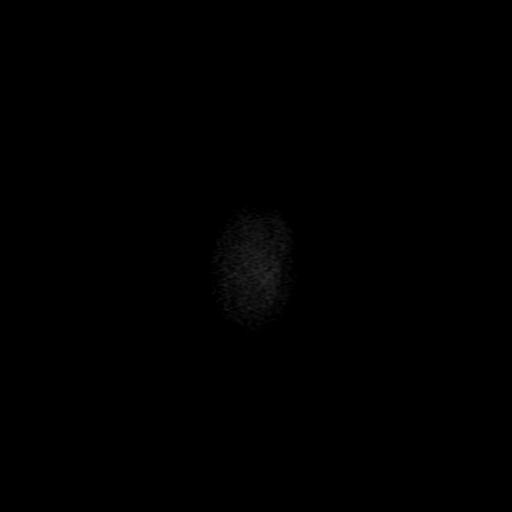

[Series 350: ADC · axial · 3.0mm · 0.94mm/px · z∈[-73,+76]mm · 4 of 51 slices shown (1 of 2)]
[im 1/51]
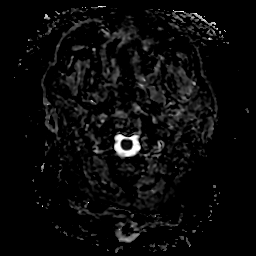
[im 17/51]
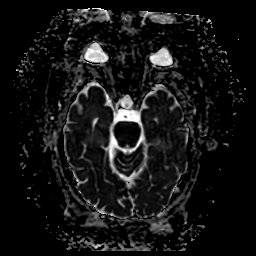
[im 34/51]
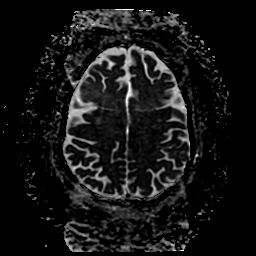
[im 51/51]
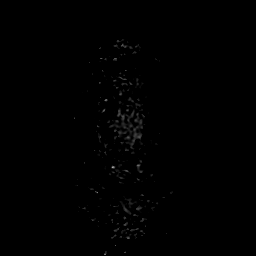

[Series 450: ADC · coronal · 4.0mm · 0.94mm/px · 3 of 37 slices shown (2 of 2)]
[im 1/37]
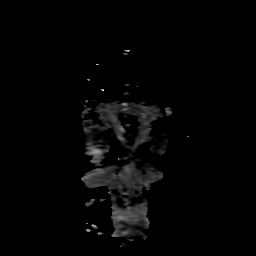
[im 19/37]
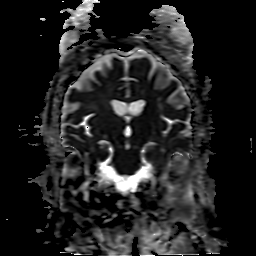
[im 37/37]
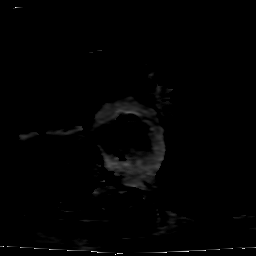

[24 of 48 positions shown; findings below may reference images not displayed]

FINDINGS: Brain: There is no acute infarction or intracranial hemorrhage.
There is no intracranial mass, mass effect, or edema. There is no
hydrocephalus or extra-axial fluid collection. Ventricles and sulci
are within normal limits in size and configuration. Patchy and
confluent T2 hyperintensity in the supratentorial white matter is
nonspecific but may reflect mild to moderate chronic microvascular
ischemic changes.

Vascular: Major vessel flow voids at the skull base are preserved.

Skull and upper cervical spine: Normal marrow signal is preserved.

Sinuses/Orbits: Paranasal sinuses are aerated. Bilateral lens
replacements.

Other: Sella is unremarkable.  Mastoid air cells are clear.
IMPRESSION: No acute infarction, hemorrhage, or mass. Mild to moderate chronic
microvascular ischemic changes.

## 2021-04-10 IMAGING — MR MR MRA NECK WO/W CM
4 of 7 series · 19 of 48 positions shown · IV contrast (10 ML GAD)
Comparison: None.

CLINICAL DATA: Acute neurologic deficit

EXAM:
MRA NECK WITHOUT AND WITH CONTRAST
TECHNIQUE: Multiplanar and multiecho pulse sequences of the neck were obtained
without and with intravenous contrast. Angiographic images of the
neck were obtained using MRA technique without and with intravenous
contrast.
CONTRAST:  10mL GADAVIST GADOBUTROL 1 MMOL/ML IV SOLN

[Series 400: cor cemra ft · coronal · 1.2mm · 0.59mm/px · 7 of 129 slices shown]
[im 1/129]
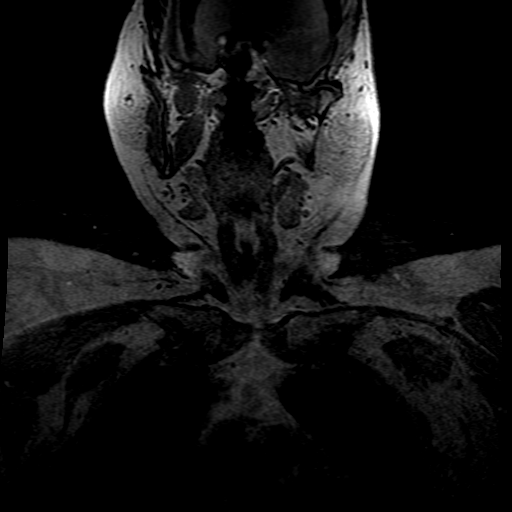
[im 22/129]
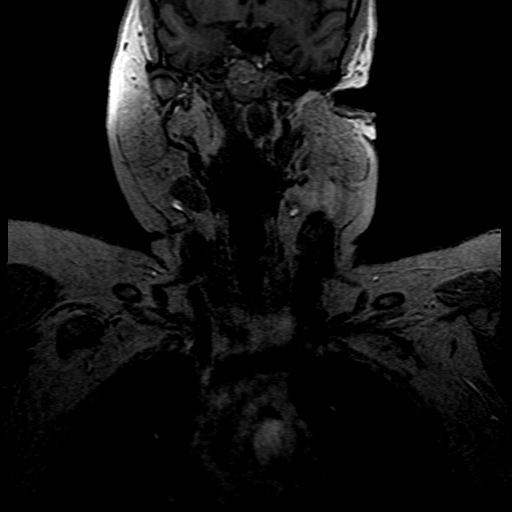
[im 43/129]
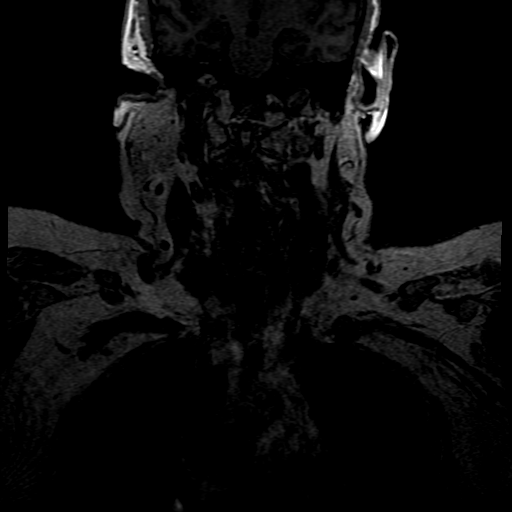
[im 65/129]
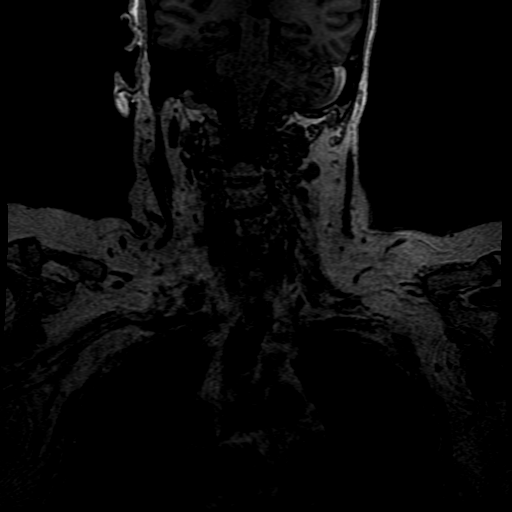
[im 86/129]
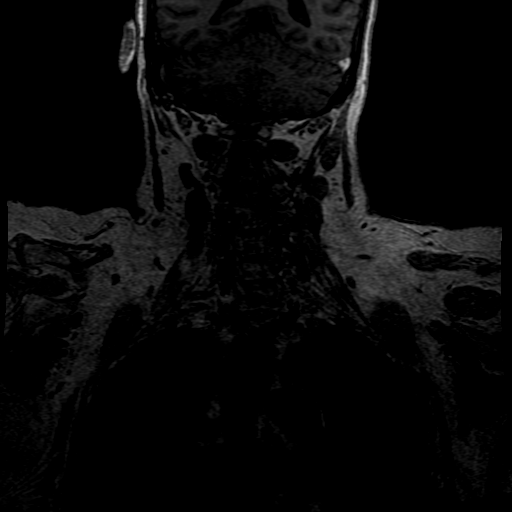
[im 107/129]
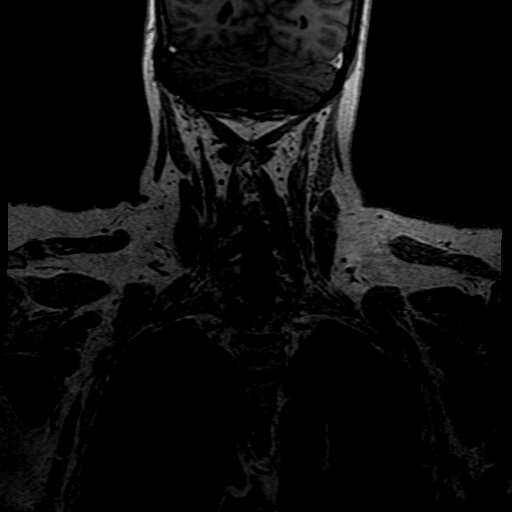
[im 129/129]
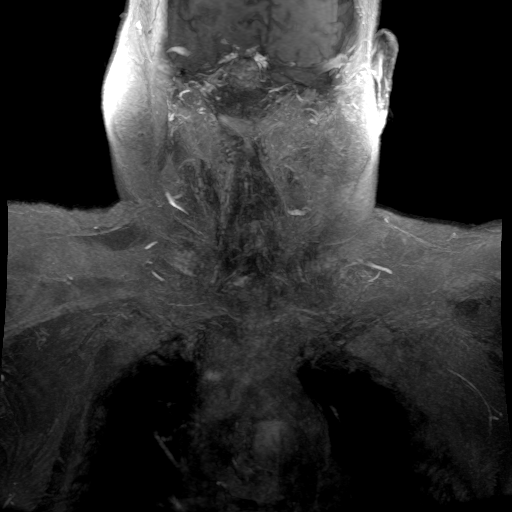

[Series 401: ph1/cor cemra ft · coronal · 1.2mm · 0.59mm/px · 6 of 129 slices shown]
[im 1/129]
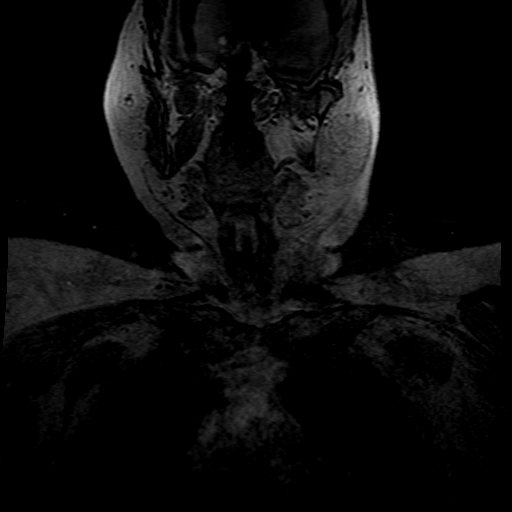
[im 19/129]
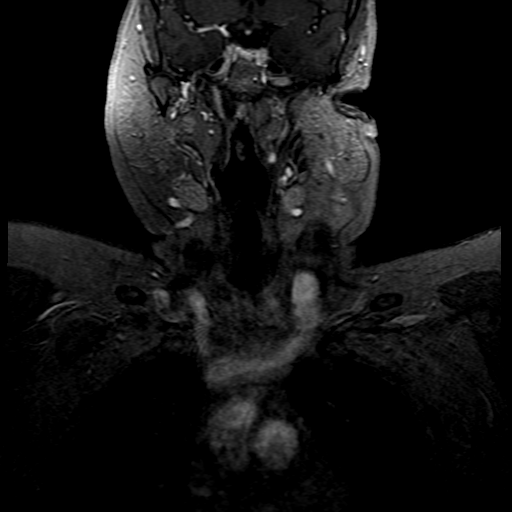
[im 37/129]
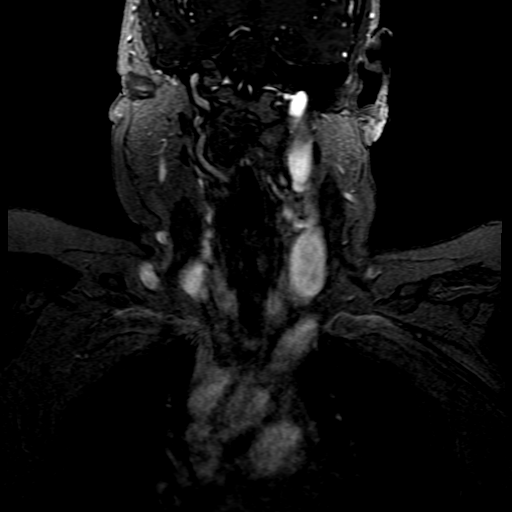
[im 55/129]
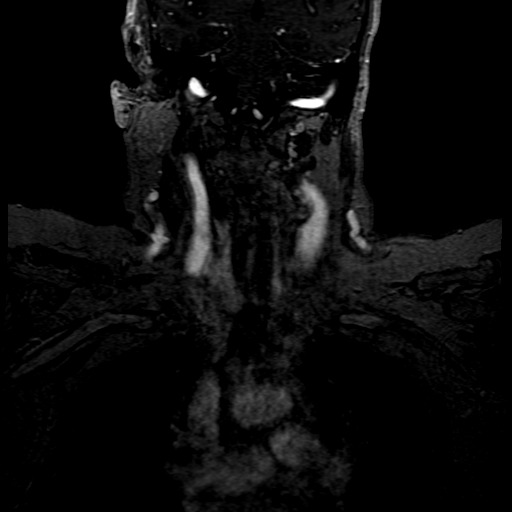
[im 74/129]
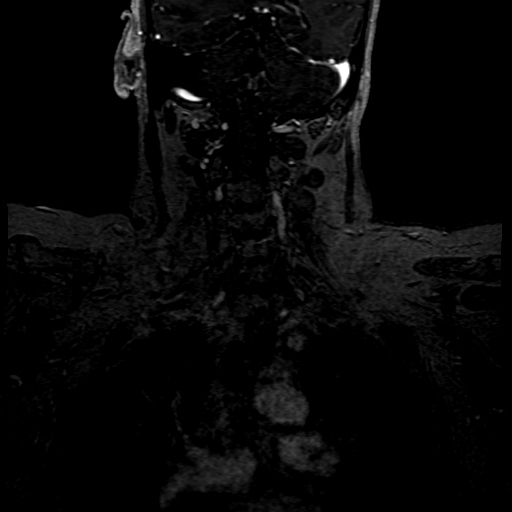
[im 110/129]
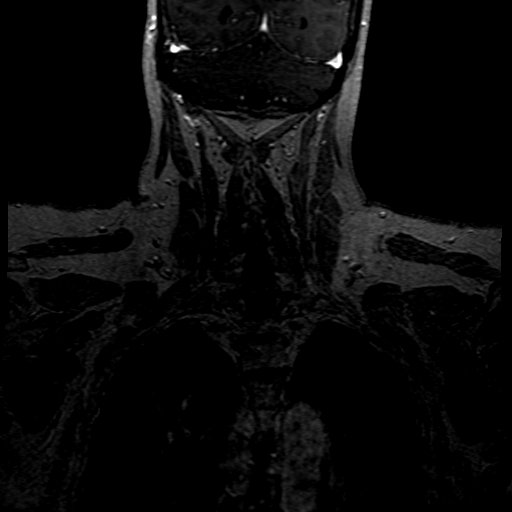

[Series 402: ph2/cor cemra ft · coronal · 1.2mm · 0.59mm/px · 3 of 129 slices shown]
[im 19/129]
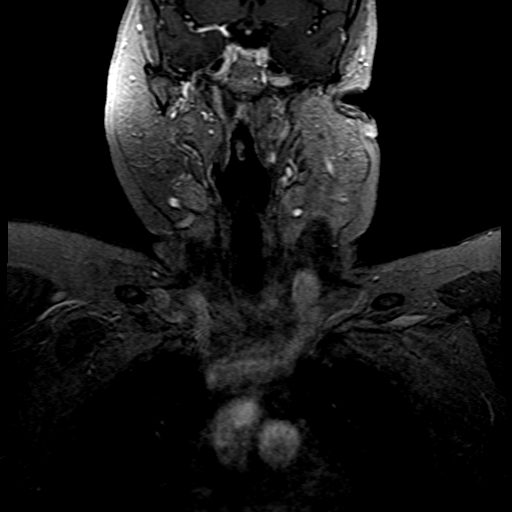
[im 74/129]
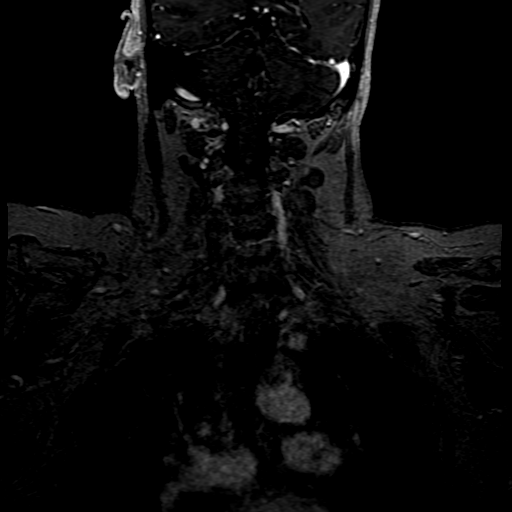
[im 110/129]
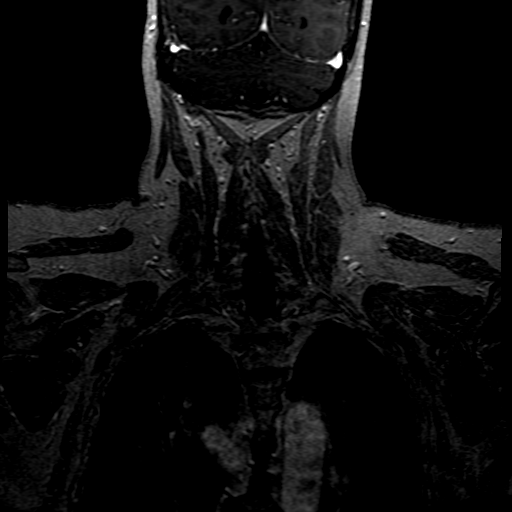

[((date))-((date)) · coronal · 1.2mm · 0.59mm/px · 3 of 129 slices shown]
[im 19/129]
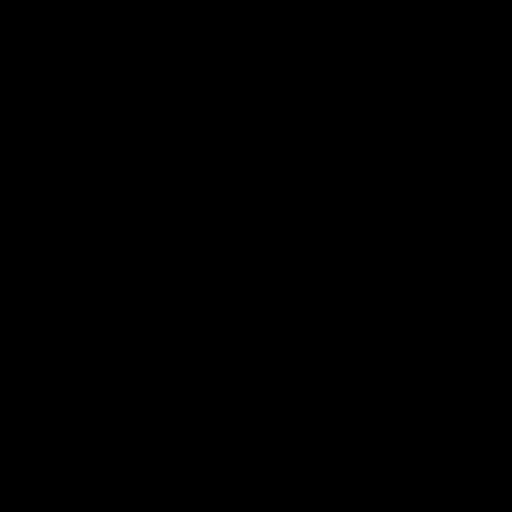
[im 74/129]
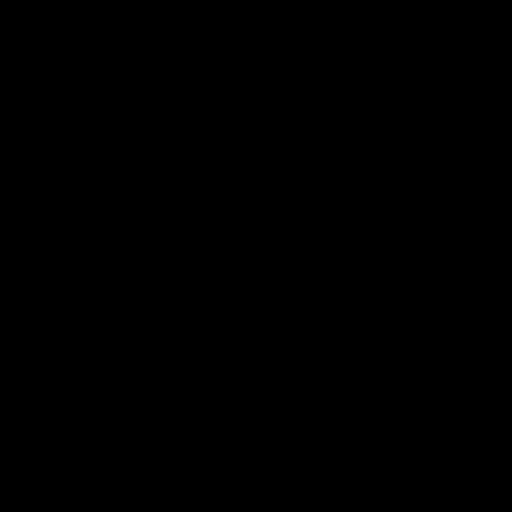
[im 110/129]
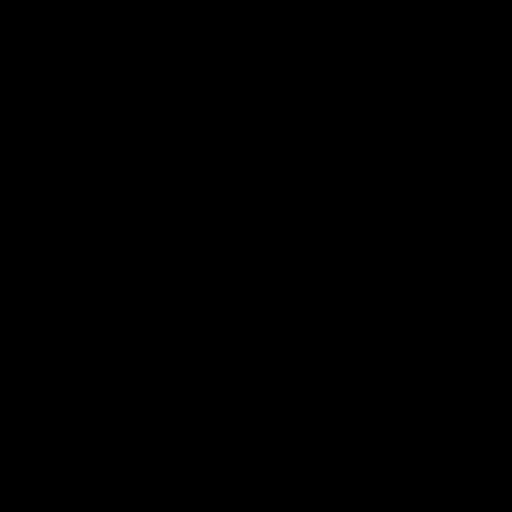

[19 of 48 positions shown; findings below may reference images not displayed]

FINDINGS: Limited visualization of the aortic arch. There is no carotid
dissection, occlusion or hemodynamically significant stenosis by
NASCET criteria. Vertebral system is codominant and normal along the
visualized portion.
IMPRESSION: No carotid dissection, occlusion or hemodynamically significant
stenosis by NASCET criteria.

## 2021-04-10 MED ORDER — GABAPENTIN 300 MG PO CAPS: 600.0000 mg | ORAL_CAPSULE | Freq: Two times a day (BID) | ORAL | Status: DC

## 2021-04-10 MED ORDER — ACETAMINOPHEN 325 MG PO TABS: 650.0000 mg | ORAL_TABLET | ORAL | Status: DC | PRN

## 2021-04-10 MED ORDER — GABAPENTIN 300 MG PO CAPS: 1200.0000 mg | ORAL_CAPSULE | Freq: Every day | ORAL | Status: DC

## 2021-04-10 MED ORDER — GADOBUTROL 1 MMOL/ML IV SOLN
10.0000 mL | Freq: Once | INTRAVENOUS | Status: AC | PRN
  Administered 2021-04-10: 10 mL via INTRAVENOUS

## 2021-04-10 MED ORDER — INSULIN ASPART 100 UNIT/ML IJ SOLN
0.0000 [IU] | Freq: Three times a day (TID) | INTRAMUSCULAR | Status: DC
Start: 2021-04-11 — End: 2021-04-11

## 2021-04-10 MED ORDER — ALBUTEROL SULFATE (2.5 MG/3ML) 0.083% IN NEBU: 2.5000 mg | INHALATION_SOLUTION | RESPIRATORY_TRACT | Status: DC | PRN

## 2021-04-10 MED ORDER — LEVOTHYROXINE SODIUM 112 MCG PO TABS
112.0000 ug | ORAL_TABLET | Freq: Every day | ORAL | Status: DC
  Administered 2021-04-11: 112 ug via ORAL
  Filled 2021-04-10: qty 1

## 2021-04-10 MED ORDER — STROKE: EARLY STAGES OF RECOVERY BOOK: Freq: Once | Status: DC

## 2021-04-10 MED ORDER — FLUTICASONE FUROATE-VILANTEROL 200-25 MCG/INH IN AEPB
1.0000 | INHALATION_SPRAY | Freq: Every day | RESPIRATORY_TRACT | Status: DC
  Administered 2021-04-11: 1 via RESPIRATORY_TRACT
  Filled 2021-04-10: qty 28

## 2021-04-10 MED ORDER — SENNOSIDES-DOCUSATE SODIUM 8.6-50 MG PO TABS: 1.0000 | ORAL_TABLET | Freq: Every evening | ORAL | Status: DC | PRN

## 2021-04-10 MED ORDER — PANTOPRAZOLE SODIUM 40 MG PO TBEC
40.0000 mg | DELAYED_RELEASE_TABLET | Freq: Every day | ORAL | Status: DC
  Administered 2021-04-11: 40 mg via ORAL
  Filled 2021-04-10: qty 1

## 2021-04-10 MED ORDER — ASPIRIN EC 81 MG PO TBEC
81.0000 mg | DELAYED_RELEASE_TABLET | Freq: Every day | ORAL | Status: DC
  Administered 2021-04-11: 81 mg via ORAL
  Filled 2021-04-10: qty 1

## 2021-04-10 MED ORDER — CLOPIDOGREL BISULFATE 75 MG PO TABS
75.0000 mg | ORAL_TABLET | Freq: Once | ORAL | Status: AC
  Administered 2021-04-10: 75 mg via ORAL
  Filled 2021-04-10: qty 1

## 2021-04-10 MED ORDER — SODIUM CHLORIDE 0.9% FLUSH: 3.0000 mL | Freq: Once | INTRAVENOUS | Status: DC

## 2021-04-10 MED ORDER — ACETAMINOPHEN 650 MG RE SUPP: 650.0000 mg | RECTAL | Status: DC | PRN

## 2021-04-10 MED ORDER — DICYCLOMINE HCL 10 MG PO CAPS: 10.0000 mg | ORAL_CAPSULE | Freq: Two times a day (BID) | ORAL | Status: DC

## 2021-04-10 MED ORDER — ENOXAPARIN SODIUM 100 MG/ML IJ SOSY
100.0000 mg | PREFILLED_SYRINGE | INTRAMUSCULAR | Status: DC
  Administered 2021-04-11: 100 mg via SUBCUTANEOUS
  Filled 2021-04-10: qty 1

## 2021-04-10 MED ORDER — ATORVASTATIN CALCIUM 40 MG PO TABS
40.0000 mg | ORAL_TABLET | Freq: Every day | ORAL | Status: DC
  Administered 2021-04-11: 40 mg via ORAL
  Filled 2021-04-10: qty 1

## 2021-04-10 MED ORDER — ASPIRIN 325 MG PO TABS
325.0000 mg | ORAL_TABLET | Freq: Once | ORAL | Status: AC
  Administered 2021-04-10: 325 mg via ORAL
  Filled 2021-04-10: qty 1

## 2021-04-10 MED ORDER — ACETAMINOPHEN 160 MG/5ML PO SOLN: 650.0000 mg | ORAL | Status: DC | PRN

## 2021-04-10 MED ORDER — BUDESONIDE 0.5 MG/2ML IN SUSP: 0.5000 mg | Freq: Two times a day (BID) | RESPIRATORY_TRACT | Status: DC

## 2021-04-10 NOTE — ED Notes (Signed)
Thew sensation is ok on herf lt leg but not above the waist

## 2021-04-10 NOTE — ED Notes (Signed)
The pt passed her swallow screen 

## 2021-04-10 NOTE — ED Triage Notes (Signed)
The pt arrived by [facility] from home  lt arm and leg weaknerss since 1400  lkw  1330 speech is clear  she has had a headached since this am  alert and oriented x4

## 2021-04-10 NOTE — Code Documentation (Signed)
Stroke Response Nurse Documentation Code Documentation  Floria Brandau is a 65 y.o. female arriving to [facility] ED via [facility] on 04-10-2021 with past medical hx of DM, TIA, GERD, Asthma. On No antithrombotic. Code stroke was activated by EMS.   Patient from home where she was LKW at 1400 and now complaining of Left arm and leg weakness and numbness . She states that she had some left side numbness yesterday morning which resolved.  Today she was her normal self until about 2pm.  She then had numbness and tingling of her left side and left arm and leg weakness.  She states that she normally walks with a cane or walker due to some left leg weakness.  Today her left leg weakness is significantly worse.  She states that she is finishing antibiotics for bronchitis.    Stroke team at the bedside on patient arrival. Labs drawn and patient cleared for CT by Dr. Rush Landmark. Patient to CT with team. NIHSS 4, see documentation for details and code stroke times. Patient with left leg weakness and left decreased sensation on exam. The following imaging was completed: CT. Patient is not a candidate for IV Thrombolytic due to symptoms mild and improving. Patient is not a candidate for IR due to no LVO suspected.   Care/Plan: VS q 15 min and neuro checks q 30 min.   Bedside handoff with ED RN Thayer Ohm.    Marcellina Millin  Stroke Response RN

## 2021-04-10 NOTE — ED Notes (Signed)
Received verbal report from Chris C RN at this time ?

## 2021-04-10 NOTE — ED Notes (Signed)
Transported to MRI at this time

## 2021-04-10 NOTE — ED Provider Notes (Signed)
[facility] EMERGENCY DEPARTMENT Provider Note   CSN: [id number] Arrival date & time: 04/10/21  1529  An emergency department physician performed an initial assessment on this suspected stroke patient at 1530.  History No chief complaint on file.   Andrea Parsons is a 65 y.o. female.  The history is provided by the patient and medical records. No language interpreter was used.  Cerebrovascular Accident This is a new problem. The current episode started 1 to 2 hours ago. The problem occurs constantly. The problem has not changed since onset.Pertinent negatives include no chest pain, no abdominal pain, no headaches and no shortness of breath. Nothing aggravates the symptoms. Nothing relieves the symptoms. She has tried nothing for the symptoms. The treatment provided no relief.      Past Medical History:  Diagnosis Date   Acid reflux    Angina pectoris    negative stress test May 2012 at [facility]   Arthritis    Asthma    Depressed    Diabetes mellitus    Infections of kidney    As child, no problems since   Stroke [facility])    Thyroid disease    TIA (transient ischemic attack)    2010    There are no problems to display for this patient.   Past Surgical History:  Procedure Laterality Date   BACK SURGERY     CESAREAN SECTION     x 2   CHOLECYSTECTOMY     SHOULDER SURGERY     TONSILLECTOMY       OB History     Gravida  7   Para  2   Term      Preterm      AB      Living         SAB      IAB      Ectopic      Multiple      Live Births              Family History  Problem Relation Age of Onset   Diabetes Mother    Heart failure Mother    Cancer Father     Social History   Tobacco Use   Smoking status: Former  Substance Use Topics   Alcohol use: No   Drug use: No    Home Medications Prior to Admission medications   Medication Sig Start Date End Date Taking? Authorizing Provider  albuterol (PROVENTIL  HFA;VENTOLIN HFA) 108 (90 BASE) MCG/ACT inhaler Inhale 2 puffs into the lungs every 4 (four) hours as needed. For shortness of breath     [provider]  aspirin 325 MG EC tablet Take 325 mg by mouth daily.      [provider]  atorvastatin (LIPITOR) 40 MG tablet Take 40 mg by mouth daily.      [provider]  calcium carbonate (TUMS - DOSED IN MG ELEMENTAL CALCIUM) 500 MG chewable tablet Chew 4 tablets by mouth 2 (two) times daily. Patient used this medication for indigestion and heartburn.    [provider]  dexlansoprazole (DEXILANT) 60 MG capsule Take 60 mg by mouth every morning.      [provider]  fluticasone (FLONASE) 50 MCG/ACT nasal spray Place 2 sprays into both nostrils daily.      [provider]  fluticasone (FLOVENT HFA) 110 MCG/ACT inhaler Inhale 2 puffs into the lungs 2 (two) times daily.      [provider]  gabapentin (NEURONTIN) 300 MG capsule Take 300 mg by mouth 2 (two) times daily.     [provider]  gabapentin (NEURONTIN) 300 MG capsule Take 1,200 mg by mouth at bedtime.      [provider]  levothyroxine (LEVOXYL) 125 MCG tablet Take 125 mcg by mouth daily.      [provider]  metFORMIN (GLUCOPHAGE) 1000 MG tablet Take 1,000 mg by mouth 2 (two) times daily.      [provider]  methocarbamol (ROBAXIN) 750 MG tablet Take 750 mg by mouth 2 (two) times daily as needed. For muscle spasms    [provider]  pioglitazone (ACTOS) 30 MG tablet Take 30 mg by mouth daily.    [provider]  promethazine (PHENERGAN) 25 MG tablet Take 1 tablet (25 mg total) by mouth every 6 (six) hours as needed for nausea. 01/05/12 01/12/12  Elson Areas, PA-C  raloxifene (EVISTA) 60 MG tablet Take 60 mg by mouth daily.     [provider]  ramipril (ALTACE) 5 MG capsule Take 5 mg by mouth daily.      [provider]  sitaGLIPtin (JANUVIA) 100 MG tablet  Take 100 mg by mouth daily.      [provider]    Allergies    Crestor [rosuvastatin calcium], Darvocet [propoxyphene n-acetaminophen], Ibuprofen, Penicillins, Ultram [tramadol hcl], and Pravachol  Review of Systems   Review of Systems  Constitutional:  Positive for chills. Negative for fatigue and fever.  HENT:  Negative for congestion.   Eyes:  Negative for visual disturbance.  Respiratory:  Positive for cough. Negative for chest tightness, shortness of breath and wheezing.   Cardiovascular:  Negative for chest pain, palpitations and leg swelling.  Gastrointestinal:  Negative for abdominal pain, constipation, diarrhea, nausea and vomiting.  Genitourinary:  Negative for dysuria, flank pain and frequency.  Musculoskeletal:  Negative for back pain, neck pain and neck stiffness.  Skin:  Negative for rash and wound.  Neurological:  Positive for weakness and numbness. Negative for light-headedness and headaches.  Psychiatric/Behavioral:  Negative for agitation and confusion.   All other systems reviewed and are negative.  Physical Exam Updated Vital Signs BP 118/75   Wt 134.4 kg   BMI 55.99 kg/m   Physical Exam Vitals and nursing note reviewed.  Constitutional:      General: She is not in acute distress.    Appearance: She is well-developed. She is not ill-appearing, toxic-appearing or diaphoretic.  HENT:     Head: Normocephalic and atraumatic.     Nose: No congestion or rhinorrhea.     Mouth/Throat:     Mouth: Mucous membranes are moist.     Pharynx: No oropharyngeal exudate or posterior oropharyngeal erythema.  Eyes:     General: No visual field deficit.    Extraocular Movements: Extraocular movements intact.     Conjunctiva/sclera: Conjunctivae normal.     Pupils: Pupils are equal, round, and reactive to light.  Cardiovascular:     Rate and Rhythm: Normal rate and regular rhythm.     Heart sounds: No murmur heard. Pulmonary:     Effort: Pulmonary effort is  normal. No respiratory distress.     Breath sounds: Normal breath sounds. No wheezing, rhonchi or rales.  Chest:     Chest wall: No tenderness.  Abdominal:     General: Abdomen is flat.     Palpations: Abdomen is soft.     Tenderness: There is no  abdominal tenderness. There is no right CVA tenderness, left CVA tenderness, guarding or rebound.  Musculoskeletal:        General: No tenderness.     Cervical back: Neck supple. No tenderness.     Right lower leg: No edema.     Left lower leg: No edema.  Skin:    General: Skin is warm and dry.     Capillary Refill: Capillary refill takes less than 2 seconds.     Findings: No erythema.  Neurological:     General: No focal deficit present.     Mental Status: She is alert.     Cranial Nerves: No cranial nerve deficit, dysarthria or facial asymmetry.     Sensory: Sensory deficit present.     Motor: Weakness present.     Comments: Numbness and weakness of left arm and left leg compared to right side.  No facial abnormality seen.   Psychiatric:        Mood and Affect: Mood normal.    ED Results / Procedures / Treatments   Labs (all labs ordered are listed, but only abnormal results are displayed) Labs Reviewed  COMPREHENSIVE METABOLIC PANEL - Abnormal; Notable for the following components:      Result Value   Sodium 134 (*)    CO2 21 (*)    Glucose, Bld 123 (*)    Creatinine, Ser 1.38 (*)    Albumin 3.4 (*)    GFR, Estimated 42 (*)    All other components within normal limits  I-STAT CHEM 8, ED - Abnormal; Notable for the following components:   Creatinine, Ser 1.30 (*)    Glucose, Bld 112 (*)    Calcium, Ion 1.00 (*)    All other components within normal limits  RESP PANEL BY RT-PCR (FLU A&B, COVID) ARPGX2  PROTIME-INR  APTT  CBC  DIFFERENTIAL  HIV ANTIBODY (ROUTINE TESTING W REFLEX)  HEMOGLOBIN A1C  LIPID PANEL    EKG None  Radiology CT HEAD CODE STROKE WO CONTRAST  Result Date: 04/10/2021 CLINICAL DATA:  Code  stroke. Slurred speech, weakness, left-sided drift numbness EXAM: CT HEAD WITHOUT CONTRAST TECHNIQUE: Contiguous axial images were obtained from the base of the skull through the vertex without intravenous contrast. COMPARISON:  CT head 07/23/2018 FINDINGS: Brain: There is no acute intracranial hemorrhage, extra-axial fluid collection, or acute infarct. There is mild parenchymal volume loss. Foci of hypodensity in the subcortical and periventricular white matter likely reflects sequela of mild chronic white matter microangiopathy. The ventricles are not enlarged. There is no mass lesion. There is no midline shift. Vascular: No hyperdense vessel or unexpected calcification. Skull: Normal. Negative for fracture or focal lesion. Sinuses/Orbits: The paranasal sinuses are clear. Other: Bilateral lens implants are in place. The globes and orbits are otherwise unremarkable. ASPECTS [facility] Stroke Program Early CT Score) - Ganglionic level infarction (caudate, lentiform nuclei, internal capsule, insula, M1-M3 cortex): 7 - Supraganglionic infarction (M4-M6 cortex): 3 Total score (0-10 with 10 being normal): 10 IMPRESSION: 1. No acute intracranial pathology. 2. ASPECTS is 10 These results were paged via AMION at the time of interpretation on 04/10/2021 at 3:45 pm to provider Dr Thomasena Edis. Electronically Signed   By: Lesia Hausen M.D.   On: 04/10/2021 15:45    Procedures Procedures   Medications Ordered in ED Medications  sodium chloride flush (NS) 0.9 % injection 3 mL (has no administration in time range)   stroke: mapping our early stages of recovery book (has no administration in time  range)  senna-docusate (Senokot-S) tablet 1 tablet (has no administration in time range)  enoxaparin (LOVENOX) injection 100 mg (has no administration in time range)  albuterol (PROVENTIL) (2.5 MG/3ML) 0.083% nebulizer solution 2.5 mg (has no administration in time range)  aspirin EC tablet 81 mg (has no administration in time  range)  atorvastatin (LIPITOR) tablet 40 mg (has no administration in time range)  pantoprazole (PROTONIX) EC tablet 40 mg (has no administration in time range)  gabapentin (NEURONTIN) capsule 600 mg (has no administration in time range)  levothyroxine (SYNTHROID) tablet 112 mcg (has no administration in time range)  insulin aspart (novoLOG) injection 0-9 Units (has no administration in time range)  fluticasone furoate-vilanterol (BREO ELLIPTA) 200-25 MCG/INH 1 puff (has no administration in time range)  aspirin tablet 325 mg (325 mg Oral Given 04/10/21 1643)    And  clopidogrel (PLAVIX) tablet 75 mg (75 mg Oral Given 04/10/21 1643)  gadobutrol (GADAVIST) 1 MMOL/ML injection 10 mL (10 mLs Intravenous Contrast Given 04/10/21 2134)    ED Course  I have reviewed the triage vital signs and the nursing notes.  Pertinent labs & imaging results that were available during my care of the patient were reviewed by me and considered in my medical decision making (see chart for details).    MDM Rules/Calculators/A&P                           Shawnee Gambone is a 65 y.o. female with a past medical history significant for asthma, diabetes, previous stroke and TIAs as well as recent bronchitis status post completion of antibiotics who presents as a code stroke.  According to patient, yesterday she had an hour long episode of left-sided numbness in her face, arm, and leg.  She reports this woke up around 4 AM and then resolved by the morning.  She says that she had no other symptoms until today around 2 PM she had onset of numbness and weakness in her left arm and left leg.  She always has a small amount of weakness in her left leg but this was much worse.  She denies any speech difficulties, vision changes, or facial symptoms today.  She reports she is still having occasional chills and cough but no further chest pain or shortness of breath.  She denies other complaints aside from some intermittent loose stools.   She denies any COVID exposures.  I evaluate the patient upon arrival to the hospital and her airway appears to be clear.  She was able to say my name without difficulty and had normal breathing.  Patient taken to CT scanner to continue her neurologic work-up.  Patient returned to the room was able to do more formal exam.  She does indeed have weakness in her left arm and left leg compared to right side.  Lungs were clear and chest was nontender.  Abdomen was nontender.  Normal sensation in her face with normal symmetric smile.  Pupils symmetric and reactive normal extraocular movements.  Some numbness is also appreciated in the left arm and left leg compared to the right side.  Will await neurology recommendations but given her symptoms yesterday and the new symptoms today, anticipate they will likely want further imaging and admission.  Neurology recommended admission to medicine for likely TIA versus stroke.  Medicine will admit.   Final Clinical Impression(s) / ED Diagnoses Final diagnoses:  Numbness    Clinical Impression: 1. Numbness  Disposition: Admit  This note was prepared with assistance of Conservation officer, historic buildings. Occasional wrong-word or sound-a-like substitutions may have occurred due to the inherent limitations of voice recognition software.      Narely Nobles, Canary Brim, MD 04/10/21 [id number]

## 2021-04-10 NOTE — H&P (Signed)
History and Physical    Andrea Parsons NWG:[id number] DOB: 1955/11/02 DOA: 04/10/2021  PCP: Malka So., MD  Patient coming from: Home via EMS  I have personally briefly reviewed patient's old medical records in [facility]  Chief Complaint: Left-sided weakness and numbness  HPI: Andrea Parsons is a 65 y.o. female with medical history significant for T2DM, HTN, HLD, hypothyroidism, asthma, CKD stage IIIa, super morbid obesity who presented to the ED for evaluation of left-sided weakness and numbness.  Patient reports chronic neuropathy largely affecting her left lower extremity.  She usually ambulates with the use of a Rollator walker.  She is recently treated for bronchitis with a course of doxycycline.  This morning (04/10/2021)  she developed new onset of left upper and lower extremity numbness and weakness.  Symptoms began around 2 PM.  She says these have largely resolved with the exception of some continued left leg weakness.  She reports chronic exertional dyspnea which is unchanged from baseline.  She denies any symptoms of fevers, chills, diaphoresis, chest pain, abdominal pain, nausea, vomiting, dysuria.  She reports a remote history of TIA.  ED Course:  Initial vitals showed BP 152/76, pulse 88, RR 23, temp 97.2 F, SPO2 97% on room air.  Labs show sodium 134, potassium 4.7, bicarb 21, BUN 14, creatinine 1.38, serum glucose 123, LFTs within normal limits, WBC 8.2, hemoglobin 12.7, platelets 267,000, INR 1.0.  CT head without contrast is negative for acute intracranial pathology.  Portable chest x-ray negative for focal consolidation, edema, effusion.  Neurology were consulted and recommended further stroke work-up with MRI brain, MRA head and neck which were ordered and pending.  Patient was given aspirin 325 mg and Plavix 75 mg.  The hospitalist service was consulted to admit for further evaluation and management.  Review of Systems: All systems reviewed and are  negative except as documented in history of present illness above.   Past Medical History:  Diagnosis Date   Acid reflux    Angina pectoris    negative stress test May 2012 at [facility]   Arthritis    Asthma    Depressed    Diabetes mellitus    Infections of kidney    As child, no problems since   Stroke [facility])    Thyroid disease    TIA (transient ischemic attack)    2010    Past Surgical History:  Procedure Laterality Date   BACK SURGERY     CESAREAN SECTION     x 2   CHOLECYSTECTOMY     SHOULDER SURGERY     TONSILLECTOMY      Social History:  reports that she has quit smoking. She does not have any smokeless tobacco history on file. She reports that she does not drink alcohol and does not use drugs.  Allergies  Allergen Reactions   Crestor [Rosuvastatin Calcium] Itching   Darvocet [Propoxyphene N-Acetaminophen] Anaphylaxis   Ibuprofen Swelling   Nsaids Other (See Comments)    Contraindicated Due To Renal Insufficiency     Penicillins Swelling   Propoxyphene Anaphylaxis and Swelling   Rosuvastatin Itching and Swelling        Ultram [Tramadol Hcl] Anaphylaxis   Metronidazole Hives   Pravachol Itching   Tape Itching and Other (See Comments)    NO CLEAR "PLASTIC" TAPE- left the skin skin red/irritated   Oxycodone-Acetaminophen Nausea And Vomiting and Other (See Comments)    Patient reports she can take anti-emetics and "be fine" (also reports  she CAN TAKE "HYDROCODONE" without problems)    Tramadol Rash    Family History  Problem Relation Age of Onset   Diabetes Mother    Heart failure Mother    Cancer Father      Prior to Admission medications   Medication Sig Start Date End Date Taking? Authorizing Provider  albuterol (PROVENTIL HFA;VENTOLIN HFA) 108 (90 BASE) MCG/ACT inhaler Inhale 2 puffs into the lungs every 4 (four) hours as needed for shortness of breath or wheezing.   Yes [provider]  albuterol (PROVENTIL) (2.5 MG/3ML)  0.083% nebulizer solution Take 2.5 mg by nebulization 3 (three) times daily.   Yes [provider]  aspirin EC 81 MG tablet Take 81 mg by mouth daily. Swallow whole.   Yes [provider]  dicyclomine (BENTYL) 10 MG capsule Take 10 mg by mouth 2 (two) times daily.   Yes [provider]  fluticasone (FLONASE) 50 MCG/ACT nasal spray Place 2 sprays into both nostrils daily as needed for allergies or rhinitis.   Yes [provider]  atorvastatin (LIPITOR) 40 MG tablet Take 40 mg by mouth daily.      [provider]  calcium carbonate (TUMS - DOSED IN MG ELEMENTAL CALCIUM) 500 MG chewable tablet Chew 4 tablets by mouth 2 (two) times daily. Patient used this medication for indigestion and heartburn.    [provider]  dexlansoprazole (DEXILANT) 60 MG capsule Take 60 mg by mouth every morning.      [provider]  fluticasone (FLOVENT HFA) 110 MCG/ACT inhaler Inhale 2 puffs into the lungs 2 (two) times daily.      [provider]  gabapentin (NEURONTIN) 300 MG capsule Take 300 mg by mouth 2 (two) times daily.     [provider]  gabapentin (NEURONTIN) 300 MG capsule Take 1,200 mg by mouth at bedtime.      [provider]  levothyroxine (LEVOXYL) 125 MCG tablet Take 125 mcg by mouth daily.      [provider]  metFORMIN (GLUCOPHAGE) 1000 MG tablet Take 1,000 mg by mouth 2 (two) times daily.      [provider]  methocarbamol (ROBAXIN) 750 MG tablet Take 750 mg by mouth 2 (two) times daily as needed. For muscle spasms    [provider]  pioglitazone (ACTOS) 30 MG tablet Take 30 mg by mouth daily.    [provider]  promethazine (PHENERGAN) 25 MG tablet Take 1 tablet (25 mg total) by mouth every 6 (six) hours as needed for nausea. 01/05/12 01/12/12  Elson Areas, PA-C  raloxifene (EVISTA) 60 MG tablet Take 60 mg by mouth daily.     [provider]  ramipril (ALTACE) 5  MG capsule Take 5 mg by mouth daily.      [provider]  sitaGLIPtin (JANUVIA) 100 MG tablet Take 100 mg by mouth daily.      [provider]    Physical Exam: Vitals:   04/10/21 1745 04/10/21 1800 04/10/21 1815 04/10/21 1830  BP: 126/64 134/78 (!) 130/58 124/63  Pulse: 83 85 83 82  Resp: (!) 24 15 20  (!) 23  Temp:      TempSrc:      SpO2: 92% 97% 92% 93%  Weight:      Height:       Constitutional: Morbidly obese woman resting supine in bed, NAD, calm, comfortable Eyes: PERRL, lids and conjunctivae normal ENMT: Mucous membranes are moist. Posterior pharynx clear of any  exudate or lesions.Normal dentition.  Neck: normal, supple, no masses. Respiratory: clear to auscultation anteriorly.  Normal respiratory effort. No accessory muscle use.  Cardiovascular: Regular rate and rhythm, no murmurs / rubs / gallops. No extremity edema. 2+ pedal pulses. Abdomen: no tenderness, no masses palpated. No hepatosplenomegaly. Bowel sounds positive.  Musculoskeletal: no clubbing / cyanosis. No joint deformity upper and lower extremities. Good ROM, no contractures. Normal muscle tone.  Skin: no rashes, lesions, ulcers. No induration Neurologic: CN 2-12 grossly intact. Sensation intact. Strength 4/5 in LLE otherwise 5/5 in bilateral upper and right lower extremities. Psychiatric: Normal judgment and insight. Alert and oriented x 3. Normal mood.   Labs on Admission: I have personally reviewed following labs and imaging studies  CBC: Recent Labs  Lab 04/10/21 1541 04/10/21 1600  WBC  --  8.2  NEUTROABS  --  4.3  HGB 13.9 12.7  HCT 41.0 40.2  MCV  --  96.9  PLT  --  267   Basic Metabolic Panel: Recent Labs  Lab 04/10/21 1541 04/10/21 1600  NA 136 134*  K 4.3 4.7  CL 101 100  CO2  --  21*  GLUCOSE 112* 123*  BUN 16 14  CREATININE 1.30* 1.38*  CALCIUM  --  9.3   GFR: Estimated Creatinine Clearance: 72.4 mL/min (A) (by C-G formula based on SCr of 1.38 mg/dL  (H)). Liver Function Tests: Recent Labs  Lab 04/10/21 1600  AST 35  ALT 20  ALKPHOS 57  BILITOT 0.4  PROT 7.5  ALBUMIN 3.4*   No results for input(s): LIPASE, AMYLASE in the last 168 hours. No results for input(s): AMMONIA in the last 168 hours. Coagulation Profile: Recent Labs  Lab 04/10/21 1600  INR 1.0   Cardiac Enzymes: No results for input(s): CKTOTAL, CKMB, CKMBINDEX, TROPONINI in the last 168 hours. BNP (last 3 results) No results for input(s): PROBNP in the last 8760 hours. HbA1C: No results for input(s): HGBA1C in the last 72 hours. CBG: No results for input(s): GLUCAP in the last 168 hours. Lipid Profile: No results for input(s): CHOL, HDL, LDLCALC, TRIG, CHOLHDL, LDLDIRECT in the last 72 hours. Thyroid Function Tests: No results for input(s): TSH, T4TOTAL, FREET4, T3FREE, THYROIDAB in the last 72 hours. Anemia Panel: No results for input(s): VITAMINB12, FOLATE, FERRITIN, TIBC, IRON, RETICCTPCT in the last 72 hours. Urine analysis:    Component Value Date/Time   COLORURINE YELLOW 01/05/2012 1830   APPEARANCEUR CLOUDY (A) 01/05/2012 1830   LABSPEC 1.006 01/05/2012 1830   PHURINE 5.5 01/05/2012 1830   GLUCOSEU NEGATIVE 01/05/2012 1830   HGBUR SMALL (A) 01/05/2012 1830   BILIRUBINUR NEGATIVE 01/05/2012 1830   KETONESUR NEGATIVE 01/05/2012 1830   PROTEINUR NEGATIVE 01/05/2012 1830   UROBILINOGEN 0.2 01/05/2012 1830   NITRITE NEGATIVE 01/05/2012 1830   LEUKOCYTESUR LARGE (A) 01/05/2012 1830    Radiological Exams on Admission: DG Chest Portable 1 View  Result Date: 04/10/2021 CLINICAL DATA:  Code stroke, bronchitis EXAM: PORTABLE CHEST 1 VIEW COMPARISON:  03/31/2021 FINDINGS: The heart size and mediastinal contours are unchanged given projection and rotation. No focal pulmonary opacity. No pleural effusion. No acute cardiopulmonary process. IMPRESSION: No active disease. Electronically Signed   By: Wiliam Ke M.D.   On: 04/10/2021 16:44   CT HEAD CODE  STROKE WO CONTRAST  Result Date: 04/10/2021 CLINICAL DATA:  Code stroke. Slurred speech, weakness, left-sided drift numbness EXAM: CT HEAD WITHOUT CONTRAST TECHNIQUE: Contiguous axial images were obtained from the base of the skull through the  vertex without intravenous contrast. COMPARISON:  CT head 07/23/2018 FINDINGS: Brain: There is no acute intracranial hemorrhage, extra-axial fluid collection, or acute infarct. There is mild parenchymal volume loss. Foci of hypodensity in the subcortical and periventricular white matter likely reflects sequela of mild chronic white matter microangiopathy. The ventricles are not enlarged. There is no mass lesion. There is no midline shift. Vascular: No hyperdense vessel or unexpected calcification. Skull: Normal. Negative for fracture or focal lesion. Sinuses/Orbits: The paranasal sinuses are clear. Other: Bilateral lens implants are in place. The globes and orbits are otherwise unremarkable. ASPECTS [facility] Stroke Program Early CT Score) - Ganglionic level infarction (caudate, lentiform nuclei, internal capsule, insula, M1-M3 cortex): 7 - Supraganglionic infarction (M4-M6 cortex): 3 Total score (0-10 with 10 being normal): 10 IMPRESSION: 1. No acute intracranial pathology. 2. ASPECTS is 10 These results were paged via AMION at the time of interpretation on 04/10/2021 at 3:45 pm to provider Dr Thomasena Edis. Electronically Signed   By: Lesia Hausen M.D.   On: 04/10/2021 15:45    EKG: Personally reviewed. Normal sinus rhythm without acute ischemic changes, low voltage, QTC 552.  QTc is more prolonged when compared to prior from 2013.  Assessment/Plan Principal Problem:   Acute left-sided weakness Active Problems:   Type 2 diabetes mellitus (HCC)   CKD (chronic kidney disease) stage 3, GFR 30-59 ml/min (HCC)   Hypertension associated with diabetes (HCC)   Asthma   Hypothyroidism   Hyperlipidemia associated with type 2 diabetes mellitus (HCC)   Andrea Parsons is a 65  y.o. female with medical history significant for T2DM, HTN, HLD, hypothyroidism, asthma, CKD stage IIIa, super morbid obesity who is admitted with for evaluation of acute left-sided weakness/numbness.  Acute left-sided weakness/numbness: Admitted for CVA evaluation.  CT head negative.  MRI brain, MRA head and neck negative for acute changes. -Obtain echocardiogram -Continue aspirin 81 mg daily -Check A1c, lipid panel -Keep on telemetry, continue neurochecks -PT/OT  Hypertension: Stable, holding ramipril for now.  Type 2 diabetes: Holding home meds, placed on sliding scale insulin.  Follow A1c.  CKD stage IIIa: Chronic and stable.  Asthma: Continue albuterol as needed.  Hypothyroidism: Continue Synthroid.  Hyperlipidemia: Continue atorvastatin.  DVT prophylaxis: Lovenox Code Status: Full code, confirmed with patient Family Communication: Discussed with patient spouse at bedside Disposition Plan: From home and likely return home pending clinical progress Consults called: EDP discussed with on-call neurology Level of care: Telemetry Medical Admission status:  Status is: Observation  The patient remains OBS appropriate and Andrea d/c before 2 midnights.   Darreld Mclean MD [facility]  If 7PM-7AM, please contact night-coverage [email or web address]  04/10/2021, 7:46 PM

## 2021-04-10 NOTE — ED Notes (Signed)
Pt remains in MRI at this time  

## 2021-04-11 ENCOUNTER — Other Ambulatory Visit (HOSPITAL_COMMUNITY): Payer: Medicare HMO

## 2021-04-11 DIAGNOSIS — R531 Weakness: Secondary | ICD-10-CM | POA: Diagnosis not present

## 2021-04-11 LAB — HIV ANTIBODY (ROUTINE TESTING W REFLEX): HIV Screen 4th Generation wRfx: NONREACTIVE

## 2021-04-11 LAB — LIPID PANEL
Cholesterol: 109 mg/dL (ref 0–200)
HDL: 41 mg/dL (ref >40)
LDL Cholesterol: 56 mg/dL (ref 0–99)
Total CHOL/HDL Ratio: 2.7 RATIO
Triglycerides: 60 mg/dL (ref <150)
VLDL: 12 mg/dL (ref 0–40)

## 2021-04-11 LAB — HEMOGLOBIN A1C
Hgb A1c MFr Bld: 6.8 % — ABNORMAL HIGH (ref 4.8–5.6)
Mean Plasma Glucose: 148.46 mg/dL

## 2021-04-11 LAB — CBG MONITORING, ED: Glucose-Capillary: 109 mg/dL — ABNORMAL HIGH (ref 70–99)

## 2021-04-11 MED ORDER — CLOPIDOGREL BISULFATE 75 MG PO TABS: 75.0000 mg | ORAL_TABLET | Freq: Every day | ORAL | 0 refills | Status: AC

## 2021-04-11 NOTE — ED Notes (Signed)
[place], DO rounded on pt. Pt to be d/c from ED today.

## 2021-04-11 NOTE — Evaluation (Signed)
Occupational Therapy Evaluation Patient Details Name: Andrea Parsons MRN: [id number] DOB: [birth year] Today's Date: 04/11/2021   History of Present Illness 65 y.o. female with medical history significant for T2DM, HTN, HLD, hypothyroidism, asthma, CKD stage IIIa, super morbid obesity who presented to the ED for evaluation of left-sided weakness and numbness.   Clinical Impression   Patient admitted for the above diagnosis.   PTA she lives with her spouse, who is able to assist as needed.  The spouse assists with ADL/IADL as needed at baseline.  RN entered room, stating orders d/c's, but OT order still in place.  OT exited the room, and accounted for eval/time in room.  Patient and spouse are in agreement, she is at her baseline, symptoms have resolved, and she is returning home with prior level of support.       Recommendations for follow up therapy are one component of a multi-disciplinary discharge planning process, led by the attending physician.  Recommendations may be updated based on patient status, additional functional criteria and insurance authorization.   Follow Up Recommendations  No OT follow up    Equipment Recommendations  None recommended by OT    Recommendations for Other Services       Precautions / Restrictions Precautions Precautions: Fall Restrictions Weight Bearing Restrictions: No      Mobility Bed Mobility Overal bed mobility: Needs Assistance Bed Mobility: Supine to Sit;Sit to Supine     Supine to sit: Min assist Sit to supine: Min assist   General bed mobility comments: assist to elevate trunk and assist with feet    Transfers                 General transfer comment: NT - patient has been walking to BR with HHA    Balance Overall balance assessment: Needs assistance         Standing balance support: Single extremity supported Standing balance-Leahy Scale: Fair                             ADL either performed or  assessed with clinical judgement   ADL Overall ADL's : At baseline                                       General ADL Comments: spouse assists with ADL/IADL as needed.  Spouse drives.     Vision Patient Visual Report: No change from baseline       Perception     Praxis      Pertinent Vitals/Pain Pain Assessment: Faces Faces Pain Scale: Hurts little more Pain Location: L knee - chronic Pain Descriptors / Indicators: Aching Pain Intervention(s): Limited activity within patient's tolerance     Hand Dominance Right   Extremity/Trunk Assessment Upper Extremity Assessment Upper Extremity Assessment: Overall WFL for tasks assessed   Lower Extremity Assessment Lower Extremity Assessment: Defer to PT evaluation   Cervical / Trunk Assessment Cervical / Trunk Assessment: Other exceptions;Kyphotic Cervical / Trunk Exceptions: body habitus   Communication Communication Communication: No difficulties   Cognition Arousal/Alertness: Awake/alert Behavior During Therapy: WFL for tasks assessed/performed Overall Cognitive Status: Within Functional Limits for tasks assessed                                     General  Comments       Exercises     Shoulder Instructions      Home Living Family/patient expects to be discharged to:: Private residence Living Arrangements: Spouse/significant other Available Help at Discharge: Family;Available PRN/intermittently Type of Home: House Home Access: Ramped entrance     Home Layout: One level     Bathroom Shower/Tub: Tub/shower unit;Walk-in shower   Bathroom Toilet: Handicapped height     Home Equipment: Cane - single point;Shower seat;Grab bars - tub/shower          Prior Functioning/Environment Level of Independence: Independent with assistive device(s)                 OT Problem List: Impaired balance (sitting and/or standing);Pain      OT Treatment/Interventions:      OT  Goals(Current goals can be found in the care plan section) Acute Rehab OT Goals Patient Stated Goal: patient returning home OT Goal Formulation: With patient Time For Goal Achievement: 04/11/21 Potential to Achieve Goals: Good  OT Frequency:     Barriers to D/C:            Co-evaluation              AM-PAC OT "6 Clicks" Daily Activity     Outcome Measure Help from another person eating meals?: None Help from another person taking care of personal grooming?: None Help from another person toileting, which includes using toliet, bedpan, or urinal?: A Little Help from another person bathing (including washing, rinsing, drying)?: A Lot Help from another person to put on and taking off regular upper body clothing?: A Little Help from another person to put on and taking off regular lower body clothing?: A Lot 6 Click Score: 18   End of Session    Activity Tolerance: Patient tolerated treatment well Patient left: in bed;with call bell/phone within reach;with family/visitor present  OT Visit Diagnosis: Pain Pain - Right/Left: Left Pain - part of body: Knee                Time: 1135-1155 OT Time Calculation (min): 20 min Charges:  OT General Charges $OT Visit: 1 Visit OT Evaluation $OT Eval Moderate Complexity: 1 Mod  04/11/2021  RP, OTR/L  Acute Rehabilitation Services  Office:  [phone]   Suzanna Obey 04/11/2021, 12:02 PM

## 2021-04-11 NOTE — ED Notes (Signed)
Breakfast Order Placed ?

## 2021-04-11 NOTE — Discharge Summary (Signed)
Physician Discharge Summary  Andrea Parsons ZOX:[id number] DOB: May 29, 1956 DOA: 04/10/2021  PCP: Malka So., MD  Admit date: 04/10/2021 Discharge date: 04/11/2021  Admitted From: Home Disposition: Home  Recommendations for Outpatient Follow-up:  Follow up with PCP as scheduled on 04/13/2021 Dual antiplatelet therapy x3 weeks with Plavix/aspirin followed by aspirin alone Recommend outpatient referral to neurology for further evaluation/surveillance  Home Health: No Equipment/Devices: None  Discharge Condition: Stable CODE STATUS: Full code Diet recommendation: Heart healthy/consistent carbohydrate diet  History of present illness:  Andrea Parsons is a 65 y.o. female with medical history significant for T2DM, HTN, HLD, hypothyroidism, asthma, CKD stage IIIa, super morbid obesity who presented to the ED for evaluation of left-sided weakness and numbness. This morning (04/10/2021) she developed new onset of left upper and lower extremity numbness and weakness.  Symptoms began around 2 PM.  She says these have largely resolved with the exception of some continued left leg weakness.  She reports chronic exertional dyspnea which is unchanged from baseline.  She denies any symptoms of fevers, chills, diaphoresis, chest pain, abdominal pain, nausea, vomiting, dysuria.   Patient reports chronic neuropathy largely affecting her left lower extremity.  She usually ambulates with the use of a Rollator walker.  She reports a remote history of TIA. She is recently treated for bronchitis with a course of doxycycline.    In the ED, BP 152/76, pulse 88, RR 23, temp 97.2 F, SPO2 97% on room air. Sodium 134, potassium 4.7, bicarb 21, BUN 14, creatinine 1.38, serum glucose 123, LFTs within normal limits, WBC 8.2, hemoglobin 12.7, platelets 267,000, INR 1.0. CT head without contrast is negative for acute intracranial pathology. Chest x-ray negative for focal consolidation, edema, effusion. Neurology was  consulted and recommended further stroke work-up with MRI brain, MRA head and neck which were ordered and pending.  Patient was given aspirin 325 mg and Plavix 75 mg.  The hospitalist service was consulted to admit for further evaluation and management.  Hospital course:  Acute left-sided weakness/numbness likely 2/2 TIA Patient presenting to the ED with acute left-sided weakness and paresthesias.  By time of ED arrival, symptoms have largely resolved.  CT head negative.  MRI brain, MRA head and neck negative for acute changes.  Patient was monitored on telemetry during hospitalization without any significant arrhythmias noted.  Hemoglobin A1c 6.8, LDL 56.  Patient wishes to discharge home without further work-up as she states symptoms have resolved; echocardiogram was not performed.  Patient was able to ambulate without issue to the restroom and declined any further PT or OT evaluation.  Will discharge on dual antiplatelet therapy with Plavix 75 mg p.o. daily and aspirin 81 mg p.o. daily x3 weeks followed by aspirin alone.  Recommend outpatient referral by PCP to neurology for further evaluation.   Hypertension: Continue ramipril 5 mg p.o. daily.   Type 2 diabetes: Hemoglobin A1c 6.8, well controlled.  Continue home metformin 1000 mg p.o. twice daily, Actos 30 mg p.o. daily, Januvia 100 mg p.o. daily.   CKD stage IIIa: Chronic and stable.  Creatinine 1.38 at time of discharge.   Asthma: Continue albuterol as needed.   Hypothyroidism: Continue levothyroxine 125 mcg p.o. daily.   Hyperlipidemia: Lipid panel with total cholesterol 109, HDL 41, LDL 56. Continue atorvastatin 40 mg p.o. daily.  GERD: Continue Dexilant 60 mg p.o. daily  Peripheral neuropathy: Continue gabapentin  Morbid obesity: Body mass index is 90.89 kg/m.  Discussed with patient needs for aggressive lifestyle changes/weight loss as this  complicates all facets of care.  Outpatient follow-up with PCP.  May benefit from  bariatric evaluation outpatient.  Discharge Diagnoses:  Active Problems:   Type 2 diabetes mellitus (HCC)   CKD (chronic kidney disease) stage 3, GFR 30-59 ml/min (HCC)   Hypertension associated with diabetes (HCC)   Asthma   Hypothyroidism   Hyperlipidemia associated with type 2 diabetes mellitus [facility])    Discharge Instructions  Discharge Instructions     Call MD for:  difficulty breathing, headache or visual disturbances   Complete by: As directed    Call MD for:  extreme fatigue   Complete by: As directed    Call MD for:  persistant dizziness or light-headedness   Complete by: As directed    Call MD for:  persistant nausea and vomiting   Complete by: As directed    Call MD for:  severe uncontrolled pain   Complete by: As directed    Call MD for:  temperature >100.4   Complete by: As directed    Diet - low sodium heart healthy   Complete by: As directed    Increase activity slowly   Complete by: As directed       Allergies as of 04/11/2021       Reactions   Crestor [rosuvastatin Calcium] Itching   Darvocet [propoxyphene N-acetaminophen] Anaphylaxis   Ibuprofen Swelling   Nsaids Other (See Comments)   Contraindicated Due To Renal Insufficiency   Penicillins Swelling   Propoxyphene Anaphylaxis, Swelling   Rosuvastatin Itching, Swelling      Ultram [tramadol Hcl] Anaphylaxis   Metronidazole Hives   Pravachol Itching   Tape Itching, Other (See Comments)   NO CLEAR "PLASTIC" TAPE- left the skin skin red/irritated   Oxycodone-acetaminophen Nausea And Vomiting, Other (See Comments)   Patient reports she can take anti-emetics and "be fine" (also reports she CAN TAKE "HYDROCODONE" without problems)   Tramadol Rash        Medication List     TAKE these medications    Actos 30 MG tablet Generic drug: pioglitazone Take 30 mg by mouth daily.   albuterol (2.5 MG/3ML) 0.083% nebulizer solution Commonly known as: PROVENTIL Take 2.5 mg by nebulization 3 (three)  times daily.   albuterol 108 (90 Base) MCG/ACT inhaler Commonly known as: VENTOLIN HFA Inhale 2 puffs into the lungs every 4 (four) hours as needed for shortness of breath or wheezing.   aspirin EC 81 MG tablet Take 81 mg by mouth daily. Swallow whole.   atorvastatin 40 MG tablet Commonly known as: LIPITOR Take 40 mg by mouth daily.   calcium carbonate 500 MG chewable tablet Commonly known as: TUMS - dosed in mg elemental calcium Chew 4 tablets by mouth 2 (two) times daily. Patient used this medication for indigestion and heartburn.   clopidogrel 75 MG tablet Commonly known as: Plavix Take 1 tablet (75 mg total) by mouth daily for 21 days.   dexlansoprazole 60 MG capsule Commonly known as: DEXILANT Take 60 mg by mouth every morning.   dicyclomine 10 MG capsule Commonly known as: BENTYL Take 10 mg by mouth 2 (two) times daily.   fluticasone 110 MCG/ACT inhaler Commonly known as: FLOVENT HFA Inhale 2 puffs into the lungs 2 (two) times daily.   fluticasone 50 MCG/ACT nasal spray Commonly known as: FLONASE Place 2 sprays into both nostrils daily as needed for allergies or rhinitis.   gabapentin 300 MG capsule Commonly known as: NEURONTIN Take 300 mg by mouth 2 (two) times  daily.   gabapentin 300 MG capsule Commonly known as: NEURONTIN Take 1,200 mg by mouth at bedtime.   Levoxyl 125 MCG tablet Generic drug: levothyroxine Take 125 mcg by mouth daily.   metFORMIN 1000 MG tablet Commonly known as: GLUCOPHAGE Take 1,000 mg by mouth 2 (two) times daily.   methocarbamol 750 MG tablet Commonly known as: ROBAXIN Take 750 mg by mouth 2 (two) times daily as needed. For muscle spasms   promethazine 25 MG tablet Commonly known as: PHENERGAN Take 1 tablet (25 mg total) by mouth every 6 (six) hours as needed for nausea.   raloxifene 60 MG tablet Commonly known as: EVISTA Take 60 mg by mouth daily.   ramipril 5 MG capsule Commonly known as: ALTACE Take 5 mg by mouth  daily.   sitaGLIPtin 100 MG tablet Commonly known as: JANUVIA Take 100 mg by mouth daily.        Follow-up Information     Malka So., MD. Go on 04/13/2021.   Specialty: Internal Medicine Why: as scheduled Contact information: [address] [place] [address] [phone]                Allergies  Allergen Reactions   Crestor [Rosuvastatin Calcium] Itching   Darvocet [Propoxyphene N-Acetaminophen] Anaphylaxis   Ibuprofen Swelling   Nsaids Other (See Comments)    Contraindicated Due To Renal Insufficiency     Penicillins Swelling   Propoxyphene Anaphylaxis and Swelling   Rosuvastatin Itching and Swelling        Ultram [Tramadol Hcl] Anaphylaxis   Metronidazole Hives   Pravachol Itching   Tape Itching and Other (See Comments)    NO CLEAR "PLASTIC" TAPE- left the skin skin red/irritated   Oxycodone-Acetaminophen Nausea And Vomiting and Other (See Comments)    Patient reports she can take anti-emetics and "be fine" (also reports she CAN TAKE "HYDROCODONE" without problems)    Tramadol Rash    Consultations: Neurology   Procedures/Studies: MR ANGIO HEAD WO CONTRAST  Result Date: 04/10/2021 CLINICAL DATA:  Acute neurologic deficit EXAM: MRA HEAD WITHOUT CONTRAST TECHNIQUE: Angiographic images of the Circle of Willis were acquired using MRA technique without intravenous contrast. COMPARISON:  No pertinent prior exam. FINDINGS: POSTERIOR CIRCULATION: --Vertebral arteries: Normal --Inferior cerebellar arteries: Normal. --Basilar artery: Normal. --Superior cerebellar arteries: Normal. --Posterior cerebral arteries: Normal. ANTERIOR CIRCULATION: --Intracranial internal carotid arteries: Normal. --Anterior cerebral arteries (ACA): Normal. --Middle cerebral arteries (MCA): Normal. ANATOMIC VARIANTS: None IMPRESSION: Normal intracranial MRA. Electronically Signed   By: Deatra Robinson M.D.   On: 04/10/2021 20:48   MR Angiogram Neck W or Wo Contrast  Result  Date: 04/10/2021 CLINICAL DATA:  Acute neurologic deficit EXAM: MRA NECK WITHOUT AND WITH CONTRAST TECHNIQUE: Multiplanar and multiecho pulse sequences of the neck were obtained without and with intravenous contrast. Angiographic images of the neck were obtained using MRA technique without and with intravenous contrast. CONTRAST:  38mL GADAVIST GADOBUTROL 1 MMOL/ML IV SOLN COMPARISON:  None. FINDINGS: Limited visualization of the aortic arch. There is no carotid dissection, occlusion or hemodynamically significant stenosis by NASCET criteria. Vertebral system is codominant and normal along the visualized portion. IMPRESSION: No carotid dissection, occlusion or hemodynamically significant stenosis by NASCET criteria. Electronically Signed   By: Deatra Robinson M.D.   On: 04/10/2021 22:19   MR BRAIN WO CONTRAST  Result Date: 04/10/2021 CLINICAL DATA:  Neuro deficit, acute, stroke suspected EXAM: MRI HEAD WITHOUT CONTRAST TECHNIQUE: Multiplanar, multiecho pulse sequences of the brain and surrounding structures were  obtained without intravenous contrast. COMPARISON:  None. FINDINGS: Brain: There is no acute infarction or intracranial hemorrhage. There is no intracranial mass, mass effect, or edema. There is no hydrocephalus or extra-axial fluid collection. Ventricles and sulci are within normal limits in size and configuration. Patchy and confluent T2 hyperintensity in the supratentorial white matter is nonspecific but may reflect mild to moderate chronic microvascular ischemic changes. Vascular: Major vessel flow voids at the skull base are preserved. Skull and upper cervical spine: Normal marrow signal is preserved. Sinuses/Orbits: Paranasal sinuses are aerated. Bilateral lens replacements. Other: Sella is unremarkable.  Mastoid air cells are clear. IMPRESSION: No acute infarction, hemorrhage, or mass. Mild to moderate chronic microvascular ischemic changes. Electronically Signed   By: Guadlupe Spanish M.D.   On:  04/10/2021 20:53   DG Chest Portable 1 View  Result Date: 04/10/2021 CLINICAL DATA:  Code stroke, bronchitis EXAM: PORTABLE CHEST 1 VIEW COMPARISON:  03/31/2021 FINDINGS: The heart size and mediastinal contours are unchanged given projection and rotation. No focal pulmonary opacity. No pleural effusion. No acute cardiopulmonary process. IMPRESSION: No active disease. Electronically Signed   By: Wiliam Ke M.D.   On: 04/10/2021 16:44   CT HEAD CODE STROKE WO CONTRAST  Result Date: 04/10/2021 CLINICAL DATA:  Code stroke. Slurred speech, weakness, left-sided drift numbness EXAM: CT HEAD WITHOUT CONTRAST TECHNIQUE: Contiguous axial images were obtained from the base of the skull through the vertex without intravenous contrast. COMPARISON:  CT head 07/23/2018 FINDINGS: Brain: There is no acute intracranial hemorrhage, extra-axial fluid collection, or acute infarct. There is mild parenchymal volume loss. Foci of hypodensity in the subcortical and periventricular white matter likely reflects sequela of mild chronic white matter microangiopathy. The ventricles are not enlarged. There is no mass lesion. There is no midline shift. Vascular: No hyperdense vessel or unexpected calcification. Skull: Normal. Negative for fracture or focal lesion. Sinuses/Orbits: The paranasal sinuses are clear. Other: Bilateral lens implants are in place. The globes and orbits are otherwise unremarkable. ASPECTS [facility] Stroke Program Early CT Score) - Ganglionic level infarction (caudate, lentiform nuclei, internal capsule, insula, M1-M3 cortex): 7 - Supraganglionic infarction (M4-M6 cortex): 3 Total score (0-10 with 10 being normal): 10 IMPRESSION: 1. No acute intracranial pathology. 2. ASPECTS is 10 These results were paged via AMION at the time of interpretation on 04/10/2021 at 3:45 pm to provider Dr Thomasena Edis. Electronically Signed   By: Lesia Hausen M.D.   On: 04/10/2021 15:45     Subjective: Patient seen examined bedside,  resting comfortably.  Continues in ED holding area.  Patient states symptoms have completely resolved and wishes to discharge home this morning.  Discussed with her further work-up was still pending and including PT/OT evaluation and echocardiogram; which she declined.  She has an appointment scheduled with her PCP on Monday and wishes to follow-up with him regarding any further treatment or testing.  No other questions or concerns at this time.  Denies headache, no visual changes, no chest pain, no palpitations, no shortness of breath, no abdominal pain, no weakness, no fatigue, no fever/chills/night sweats, no nausea/vomiting/diarrhea.  No acute events overnight per nursing staff  Discharge Exam: Vitals:   04/11/21 0615 04/11/21 1015  BP: 112/79 99/67  Pulse: 71 82  Resp: 19 20  Temp:    SpO2: (!) 89% 95%   Vitals:   04/11/21 0445 04/11/21 0530 04/11/21 0615 04/11/21 1015  BP: 125/61 112/89 112/79 99/67  Pulse: 71 72 71 82  Resp: 16 (!) 22 19 20  Temp:      TempSrc:      SpO2: 99% 92% (!) 89% 95%  Weight:      Height:        General: Pt is alert, awake, not in acute distress, obese Cardiovascular: RRR, S1/S2 +, no rubs, no gallops Respiratory: CTA bilaterally, no wheezing, no rhonchi, on room air Abdominal: Soft, NT, ND, bowel sounds + Extremities: no edema, no cyanosis    The results of significant diagnostics from this hospitalization (including imaging, microbiology, ancillary and laboratory) are listed below for reference.     Microbiology: Recent Results (from the past 240 hour(s))  Resp Panel by RT-PCR (Flu A&B, Covid) Nasopharyngeal Swab     Status: None   Collection Time: 04/10/21  4:49 PM   Specimen: Nasopharyngeal Swab; Nasopharyngeal(NP) swabs in vial transport medium  Result Value Ref Range Status   SARS Coronavirus 2 by RT PCR NEGATIVE NEGATIVE Final    Comment: (NOTE) SARS-CoV-2 target nucleic acids are NOT DETECTED.  The SARS-CoV-2 RNA is generally  detectable in upper respiratory specimens during the acute phase of infection. The lowest concentration of SARS-CoV-2 viral copies this assay can detect is 138 copies/mL. A negative result does not preclude SARS-Cov-2 infection and should not be used as the sole basis for treatment or other patient management decisions. A negative result may occur with  improper specimen collection/handling, submission of specimen other than nasopharyngeal swab, presence of viral mutation(s) within the areas targeted by this assay, and inadequate number of viral copies(<138 copies/mL). A negative result must be combined with clinical observations, patient history, and epidemiological information. The expected result is Negative.  Fact Sheet for Patients:  BloggerCourse.com  Fact Sheet for Healthcare Providers:  SeriousBroker.it  This test is no t yet approved or cleared by the [place] FDA and  has been authorized for detection and/or diagnosis of SARS-CoV-2 by FDA under an Emergency Use Authorization (EUA). This EUA will remain  in effect (meaning this test can be used) for the duration of the COVID-19 declaration under Section 564(b)(1) of the Act, 21 U.S.C.section 360bbb-3(b)(1), unless the authorization is terminated  or revoked sooner.       Influenza A by PCR NEGATIVE NEGATIVE Final   Influenza B by PCR NEGATIVE NEGATIVE Final    Comment: (NOTE) The Xpert Xpress SARS-CoV-2/FLU/RSV plus assay is intended as an aid in the diagnosis of influenza from Nasopharyngeal swab specimens and should not be used as a sole basis for treatment. Nasal washings and aspirates are unacceptable for Xpert Xpress SARS-CoV-2/FLU/RSV testing.  Fact Sheet for Patients: BloggerCourse.com  Fact Sheet for Healthcare Providers: SeriousBroker.it  This test is not yet approved or cleared by the [place] FDA  and has been authorized for detection and/or diagnosis of SARS-CoV-2 by FDA under an Emergency Use Authorization (EUA). This EUA will remain in effect (meaning this test can be used) for the duration of the COVID-19 declaration under Section 564(b)(1) of the Act, 21 U.S.C. section 360bbb-3(b)(1), unless the authorization is terminated or revoked.  Performed at [facility], [address], [place], [place] [address]      Labs: BNP (last 3 results) No results for input(s): BNP in the last 8760 hours. Basic Metabolic Panel: Recent Labs  Lab 04/10/21 1541 04/10/21 1600  NA 136 134*  K 4.3 4.7  CL 101 100  CO2  --  21*  GLUCOSE 112* 123*  BUN 16 14  CREATININE 1.30* 1.38*  CALCIUM  --  9.3  Liver Function Tests: Recent Labs  Lab 04/10/21 1600  AST 35  ALT 20  ALKPHOS 57  BILITOT 0.4  PROT 7.5  ALBUMIN 3.4*   No results for input(s): LIPASE, AMYLASE in the last 168 hours. No results for input(s): AMMONIA in the last 168 hours. CBC: Recent Labs  Lab 04/10/21 1541 04/10/21 1600  WBC  --  8.2  NEUTROABS  --  4.3  HGB 13.9 12.7  HCT 41.0 40.2  MCV  --  96.9  PLT  --  267   Cardiac Enzymes: No results for input(s): CKTOTAL, CKMB, CKMBINDEX, TROPONINI in the last 168 hours. BNP: Invalid input(s): POCBNP CBG: Recent Labs  Lab 04/11/21 0731  GLUCAP 109*   D-Dimer No results for input(s): DDIMER in the last 72 hours. Hgb A1c Recent Labs    04/11/21 0341  HGBA1C 6.8*   Lipid Profile Recent Labs    04/11/21 0341  CHOL 109  HDL 41  LDLCALC 56  TRIG 60  CHOLHDL 2.7   Thyroid function studies No results for input(s): TSH, T4TOTAL, T3FREE, THYROIDAB in the last 72 hours.  Invalid input(s): FREET3 Anemia work up No results for input(s): VITAMINB12, FOLATE, FERRITIN, TIBC, IRON, RETICCTPCT in the last 72 hours. Urinalysis    Component Value Date/Time   COLORURINE YELLOW 01/05/2012 1830   APPEARANCEUR CLOUDY (A) 01/05/2012 1830   LABSPEC  1.006 01/05/2012 1830   PHURINE 5.5 01/05/2012 1830   GLUCOSEU NEGATIVE 01/05/2012 1830   HGBUR SMALL (A) 01/05/2012 1830   BILIRUBINUR NEGATIVE 01/05/2012 1830   KETONESUR NEGATIVE 01/05/2012 1830   PROTEINUR NEGATIVE 01/05/2012 1830   UROBILINOGEN 0.2 01/05/2012 1830   NITRITE NEGATIVE 01/05/2012 1830   LEUKOCYTESUR LARGE (A) 01/05/2012 1830   Sepsis Labs Invalid input(s): PROCALCITONIN,  WBC,  LACTICIDVEN Microbiology Recent Results (from the past 240 hour(s))  Resp Panel by RT-PCR (Flu A&B, Covid) Nasopharyngeal Swab     Status: None   Collection Time: 04/10/21  4:49 PM   Specimen: Nasopharyngeal Swab; Nasopharyngeal(NP) swabs in vial transport medium  Result Value Ref Range Status   SARS Coronavirus 2 by RT PCR NEGATIVE NEGATIVE Final    Comment: (NOTE) SARS-CoV-2 target nucleic acids are NOT DETECTED.  The SARS-CoV-2 RNA is generally detectable in upper respiratory specimens during the acute phase of infection. The lowest concentration of SARS-CoV-2 viral copies this assay can detect is 138 copies/mL. A negative result does not preclude SARS-Cov-2 infection and should not be used as the sole basis for treatment or other patient management decisions. A negative result may occur with  improper specimen collection/handling, submission of specimen other than nasopharyngeal swab, presence of viral mutation(s) within the areas targeted by this assay, and inadequate number of viral copies(<138 copies/mL). A negative result must be combined with clinical observations, patient history, and epidemiological information. The expected result is Negative.  Fact Sheet for Patients:  BloggerCourse.com  Fact Sheet for Healthcare Providers:  SeriousBroker.it  This test is no t yet approved or cleared by the [place] FDA and  has been authorized for detection and/or diagnosis of SARS-CoV-2 by FDA under an Emergency Use Authorization  (EUA). This EUA will remain  in effect (meaning this test can be used) for the duration of the COVID-19 declaration under Section 564(b)(1) of the Act, 21 U.S.C.section 360bbb-3(b)(1), unless the authorization is terminated  or revoked sooner.       Influenza A by PCR NEGATIVE NEGATIVE Final   Influenza B by PCR NEGATIVE NEGATIVE Final  Comment: (NOTE) The Xpert Xpress SARS-CoV-2/FLU/RSV plus assay is intended as an aid in the diagnosis of influenza from Nasopharyngeal swab specimens and should not be used as a sole basis for treatment. Nasal washings and aspirates are unacceptable for Xpert Xpress SARS-CoV-2/FLU/RSV testing.  Fact Sheet for Patients: BloggerCourse.com  Fact Sheet for Healthcare Providers: SeriousBroker.it  This test is not yet approved or cleared by the [place] FDA and has been authorized for detection and/or diagnosis of SARS-CoV-2 by FDA under an Emergency Use Authorization (EUA). This EUA will remain in effect (meaning this test can be used) for the duration of the COVID-19 declaration under Section 564(b)(1) of the Act, 21 U.S.C. section 360bbb-3(b)(1), unless the authorization is terminated or revoked.  Performed at [facility], [address] [place], [place] [address]      Time coordinating discharge: Over 30 minutes  SIGNED:   Alvira Philips [place], DO  [facility] 04/11/2021, 11:51 AM

## 2021-04-13 LAB — CBG MONITORING, ED: Glucose-Capillary: 106 mg/dL — ABNORMAL HIGH (ref 70–99)
